# Patient Record
Sex: Male | Born: 1940 | Race: Black or African American | Hispanic: No | Marital: Married | Smoking: Never smoker
Health system: Southern US, Community
[De-identification: ages and names within clinical notes are randomized; demographics above are authoritative.]

## PROBLEM LIST (undated history)

## (undated) DIAGNOSIS — G4730 Sleep apnea, unspecified: Secondary | ICD-10-CM

## (undated) DIAGNOSIS — E0590 Thyrotoxicosis, unspecified without thyrotoxic crisis or storm: Secondary | ICD-10-CM

## (undated) DIAGNOSIS — F329 Major depressive disorder, single episode, unspecified: Secondary | ICD-10-CM

## (undated) DIAGNOSIS — I10 Essential (primary) hypertension: Secondary | ICD-10-CM

## (undated) DIAGNOSIS — F32A Depression, unspecified: Secondary | ICD-10-CM

## (undated) HISTORY — DX: Thyrotoxicosis, unspecified without thyrotoxic crisis or storm: E05.90

## (undated) HISTORY — PX: CHOLECYSTECTOMY: SHX55

## (undated) HISTORY — PX: BACK SURGERY: SHX140

## (undated) HISTORY — PX: CATARACT EXTRACTION, BILATERAL: SHX1313

## (undated) HISTORY — DX: Depression, unspecified: F32.A

## (undated) HISTORY — DX: Major depressive disorder, single episode, unspecified: F32.9

---

## 2014-02-22 ENCOUNTER — Other Ambulatory Visit (HOSPITAL_COMMUNITY): Payer: Self-pay | Admitting: "Endocrinology

## 2014-02-22 DIAGNOSIS — E0590 Thyrotoxicosis, unspecified without thyrotoxic crisis or storm: Secondary | ICD-10-CM

## 2014-03-01 ENCOUNTER — Encounter (HOSPITAL_COMMUNITY): Payer: Self-pay

## 2014-03-01 ENCOUNTER — Encounter (HOSPITAL_COMMUNITY)
Admission: RE | Admit: 2014-03-01 | Discharge: 2014-03-01 | Disposition: A | Discharge status: Home / Self Care | Payer: Medicare PPO | Source: Ambulatory Visit | Attending: Diagnostic Radiology | Admitting: Diagnostic Radiology

## 2014-03-01 DIAGNOSIS — E0590 Thyrotoxicosis, unspecified without thyrotoxic crisis or storm: Secondary | ICD-10-CM | POA: Insufficient documentation

## 2014-03-01 HISTORY — DX: Essential (primary) hypertension: I10

## 2014-03-01 MED ORDER — SODIUM IODIDE I 131 CAPSULE
9.0000 | Freq: Once | INTRAVENOUS | Status: AC | PRN
  Administered 2014-03-01: 9 via ORAL

## 2014-03-02 ENCOUNTER — Encounter (HOSPITAL_COMMUNITY)
Admission: RE | Admit: 2014-03-02 | Discharge: 2014-03-02 | Disposition: A | Discharge status: Home / Self Care | Payer: Medicare PPO | Source: Ambulatory Visit | Attending: "Endocrinology | Admitting: "Endocrinology

## 2014-03-02 DIAGNOSIS — E0590 Thyrotoxicosis, unspecified without thyrotoxic crisis or storm: Secondary | ICD-10-CM | POA: Diagnosis not present

## 2014-03-02 MED ORDER — SODIUM PERTECHNETATE TC 99M INJECTION
10.0000 | Freq: Once | INTRAVENOUS | Status: AC | PRN
  Administered 2014-03-02: 10 via INTRAVENOUS

## 2014-03-05 ENCOUNTER — Other Ambulatory Visit (HOSPITAL_COMMUNITY): Payer: Self-pay | Admitting: "Endocrinology

## 2014-03-05 DIAGNOSIS — E041 Nontoxic single thyroid nodule: Secondary | ICD-10-CM

## 2014-03-11 ENCOUNTER — Ambulatory Visit (HOSPITAL_COMMUNITY): Admission: RE | Admit: 2014-03-11 | Payer: Medicare PPO | Source: Ambulatory Visit

## 2014-03-18 ENCOUNTER — Other Ambulatory Visit (HOSPITAL_COMMUNITY): Payer: Self-pay | Admitting: "Endocrinology

## 2014-03-18 ENCOUNTER — Ambulatory Visit (HOSPITAL_COMMUNITY)
Admission: RE | Admit: 2014-03-18 | Discharge: 2014-03-18 | Disposition: A | Discharge status: Home / Self Care | Payer: Medicare PPO | Source: Ambulatory Visit | Attending: "Endocrinology | Admitting: "Endocrinology

## 2014-03-18 ENCOUNTER — Encounter (HOSPITAL_COMMUNITY): Payer: Self-pay

## 2014-03-18 DIAGNOSIS — E042 Nontoxic multinodular goiter: Secondary | ICD-10-CM

## 2014-03-18 DIAGNOSIS — E041 Nontoxic single thyroid nodule: Secondary | ICD-10-CM | POA: Diagnosis not present

## 2014-03-18 MED ORDER — LIDOCAINE HCL (PF) 2 % IJ SOLN
INTRAMUSCULAR | Status: AC
  Filled 2014-03-18: qty 10

## 2014-03-18 MED ORDER — LIDOCAINE HCL (PF) 2 % IJ SOLN
10.0000 mL | Freq: Once | INTRAMUSCULAR | Status: AC
  Administered 2014-03-18: 10 mL

## 2014-03-18 NOTE — Procedures (Signed)
PreOperative Dx: Multiple thyroid nodules Postoperative Dx: Multiple thyroid thyroid nodules Procedure:   US guided FNA of 3 thyroid nodules Radiologist:  Tyron RussellBoles Anesthesia:  3.5 ml of 2% lidocaine Specimen:  FNA x 3 thyroid nodules  EBL:   < 1 ml Complications: None

## 2014-03-18 NOTE — Discharge Instructions (Signed)
Thyroid Biopsy °The thyroid gland is a butterfly-shaped gland situated in the front of the neck. It produces hormones which affect metabolism, growth and development, and body temperature. A thyroid biopsy is a procedure in which small samples of tissue or fluid are removed from the thyroid gland or mass and examined under a microscope. This test is done to determine the cause of thyroid problems, such as infection, cancer, or other thyroid problems. °There are 2 ways to obtain samples: °1. Fine needle biopsy. Samples are removed using a thin needle inserted through the skin and into the thyroid gland or mass. °2. Open biopsy. Samples are removed after a cut (incision) is made through the skin. °LET YOUR CAREGIVER KNOW ABOUT:  °· Allergies. °· Medications taken including herbs, eye drops, over-the-counter medications, and creams. °· Use of steroids (by mouth or creams). °· Previous problems with anesthetics or numbing medicine. °· Possibility of pregnancy, if this applies. °· History of blood clots (thrombophlebitis). °· History of bleeding or blood problems. °· Previous surgery. °· Other health problems. °RISKS AND COMPLICATIONS °· Bleeding from the site. The risk of bleeding is higher if you have a bleeding disorder or are taking any blood thinning medications (anticoagulants). °· Infection. °· Injury to structures near the thyroid gland. °BEFORE THE PROCEDURE  °This is a procedure that can be done as an outpatient. Confirm the time that you need to arrive for your procedure. Confirm whether there is a need to fast or withhold any medications. A blood sample may be done to determine your blood clotting time. Medicine may be given to help you relax (sedative). °PROCEDURE °Fine needle biopsy. °You will be awake during the procedure. You may be asked to lie on your back with your head tipped backward to extend your neck. Let your caregiver know if you cannot tolerate the positioning. An area on your neck will be  cleansed. A needle is inserted through the skin of your neck. You may feel a mild discomfort during this procedure. You may be asked to avoid coughing, talking, swallowing, or making sounds during some portions of the procedure. The needle is withdrawn once tissue or fluid samples have been removed. Pressure may be applied to the neck to reduce swelling and ensure that bleeding has stopped. The samples will be sent for examination.  °Open biopsy. °You will be given general anesthesia. You will be asleep during the procedure. An incision is made in your neck. A sample of thyroid tissue or the mass is removed. The tissue sample or mass will be sent for examination. The sample or mass may be examined during the biopsy. If the sample or mass contains cancer cells, some or all of the thyroid gland may be removed. The incision is closed with stitches. °AFTER THE PROCEDURE  °Your recovery will be assessed and monitored. If there are no problems, as an outpatient, you should be able to go home shortly after the procedure. °If you had a fine needle biopsy: °· You may have soreness at the biopsy site for 1 to 2 days. °If you had an open biopsy:  °· You may have soreness at the biopsy site for 3 to 4 days. °· You may have a hoarse voice or sore throat for 1 to 2 days. °Obtaining the Test Results °It is your responsibility to obtain your test results. Do not assume everything is normal if you have not heard from your caregiver or the medical facility. It is important for you to follow up   on all of your test results. °HOME CARE INSTRUCTIONS  °· Keeping your head raised on a pillow when you are lying down may ease biopsy site discomfort. °· Supporting the back of your head and neck with both hands as you sit up from a lying position may ease biopsy site discomfort. °· Only take over-the-counter or prescription medicines for pain, discomfort, or fever as directed by your caregiver. °· Throat lozenges or gargling with warm salt  water may help to soothe a sore throat. °SEEK IMMEDIATE MEDICAL CARE IF:  °· You have severe bleeding from the biopsy site. °· You have difficulty swallowing. °· You have a fever. °· You have increased pain, swelling, redness, or warmth at the biopsy site. °· You notice pus coming from the biopsy site. °· You have swollen glands (lymph nodes) in your neck. °Document Released: 02/18/2007 Document Revised: 08/18/2012 Document Reviewed: 07/16/2013 °[employer]® Patient Information ©2015 [employer]. This information is not intended to replace advice given to you by your health care provider. Make sure you discuss any questions you have with your health care provider. ° °

## 2015-08-02 IMAGING — US US THYROID BIOPSY
1 series · 13 of 23 positions shown · non-contrast
Comparison: none

CLINICAL DATA: Multiple thyroid nodules/masses

[Series 1: us thyroid biopsy · 0.05mm/px · 13 of 23 slices shown]
[im 1/23]
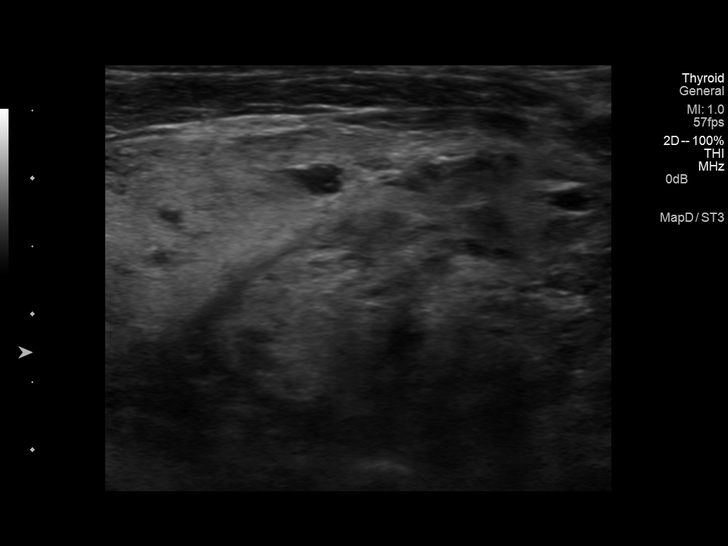
[im 3/23]
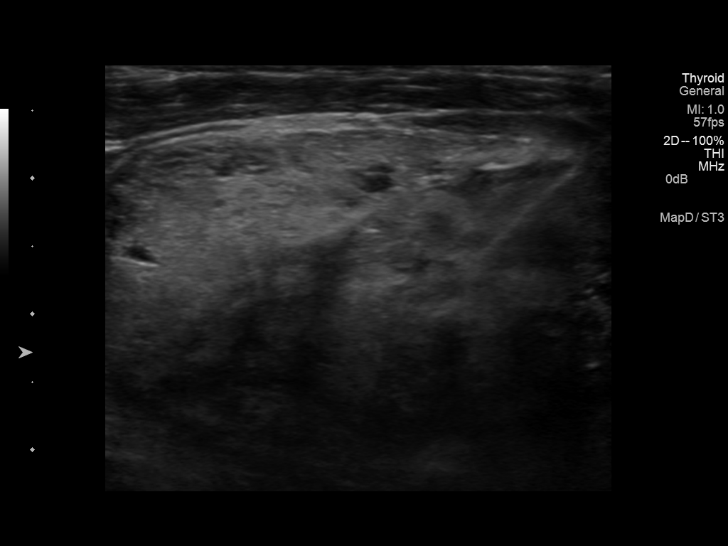
[im 5/23]
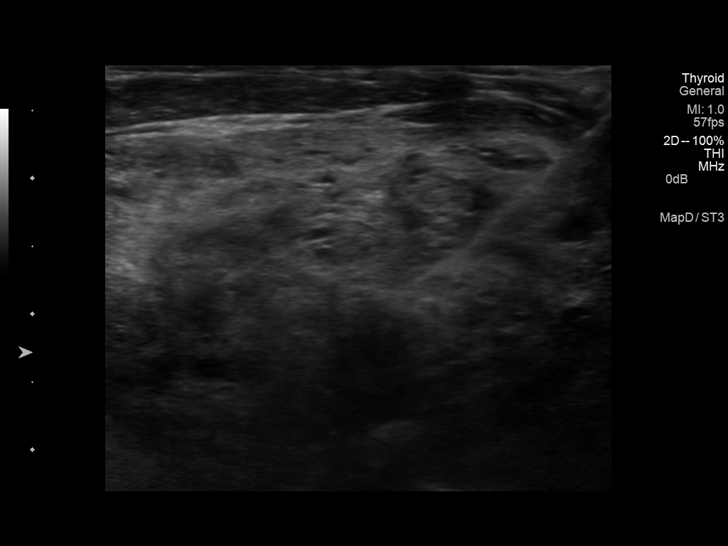
[im 7/23]
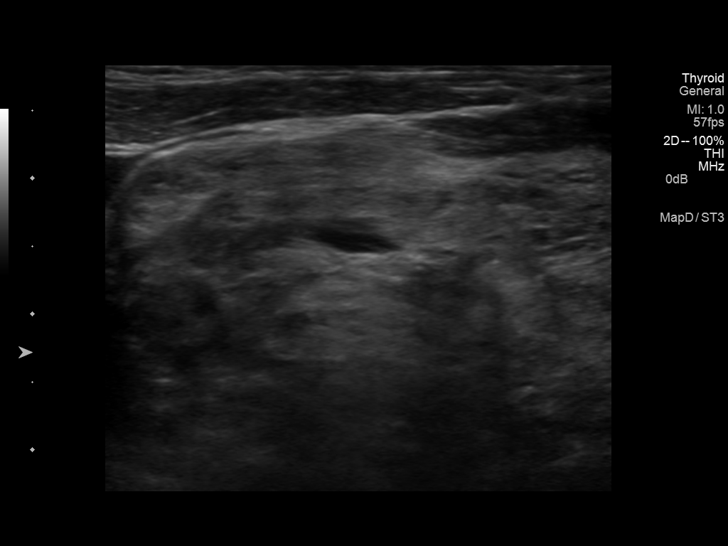
[im 8/23]
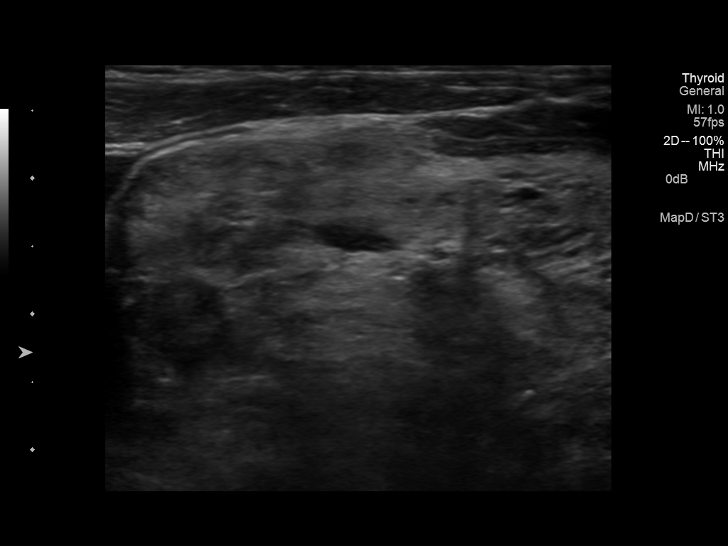
[im 10/23]
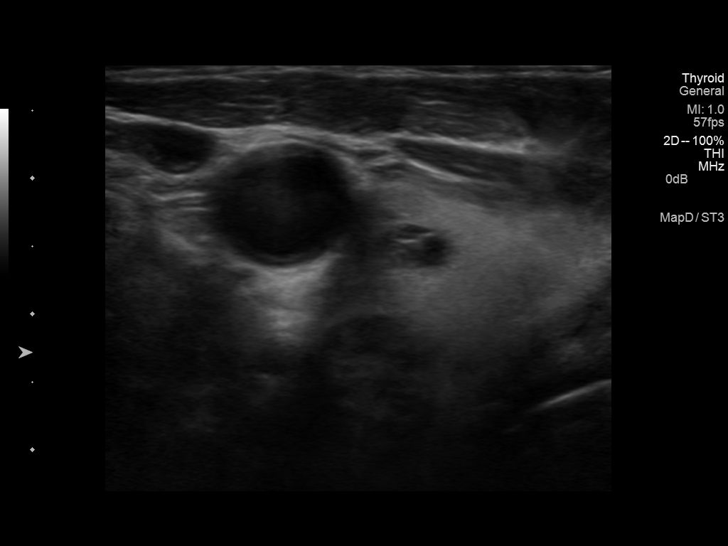
[im 12/23]
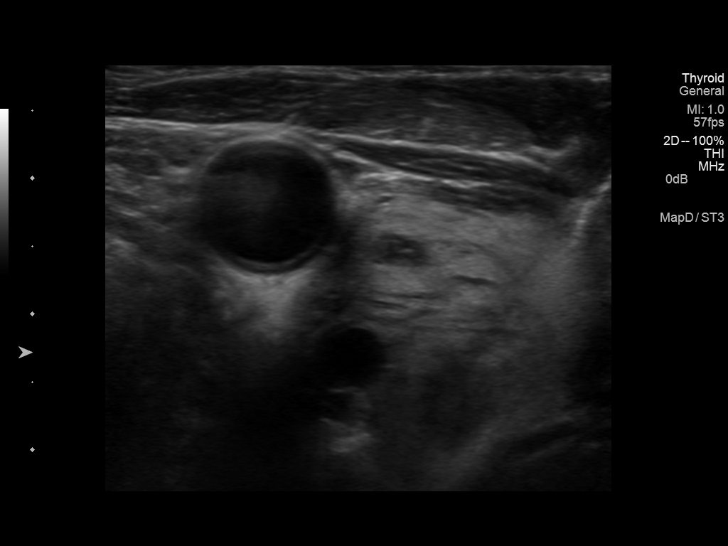
[im 14/23]
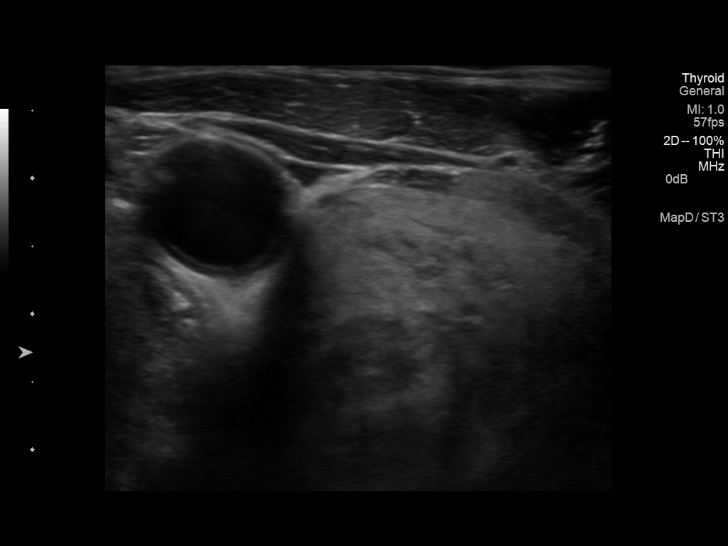
[im 16/23]
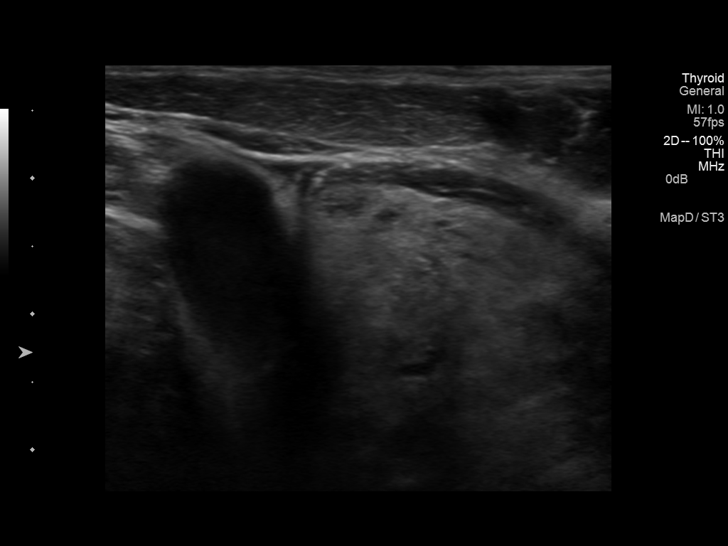
[im 17/23]
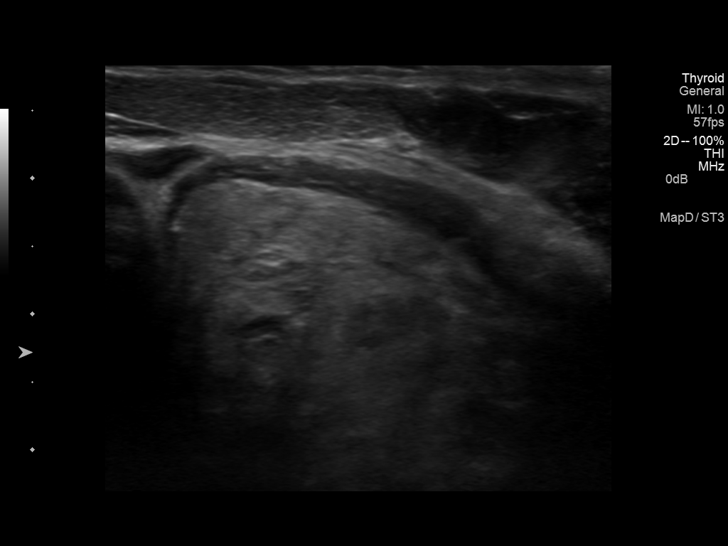
[im 19/23]
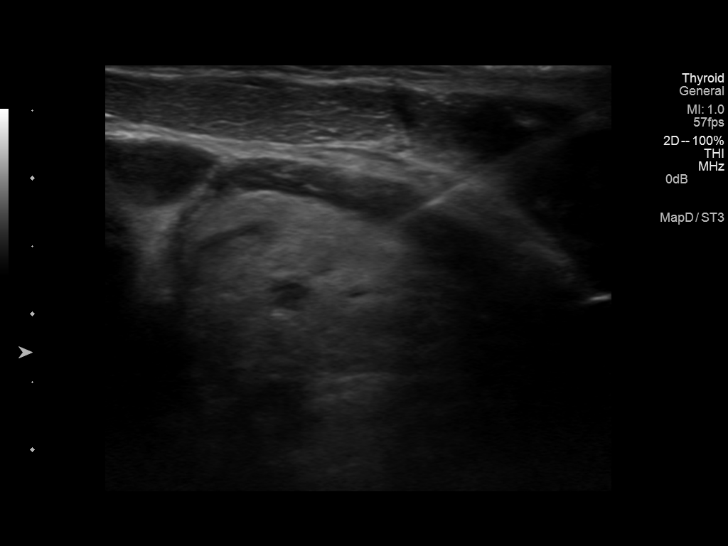
[im 21/23]
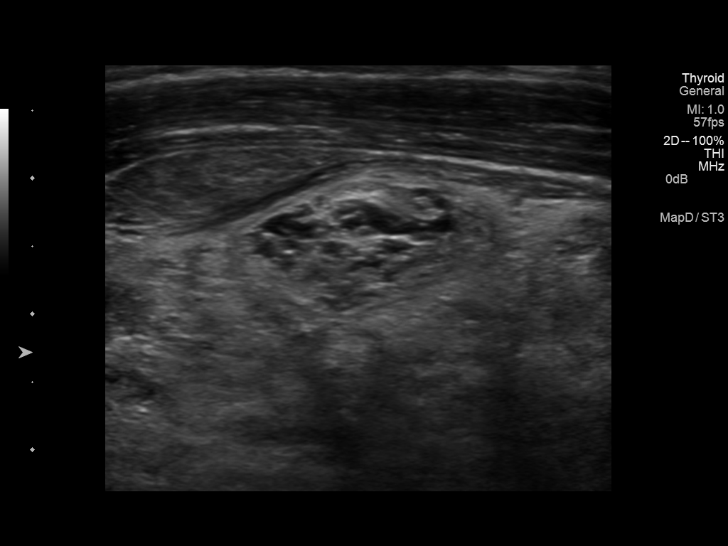
[im 23/23]
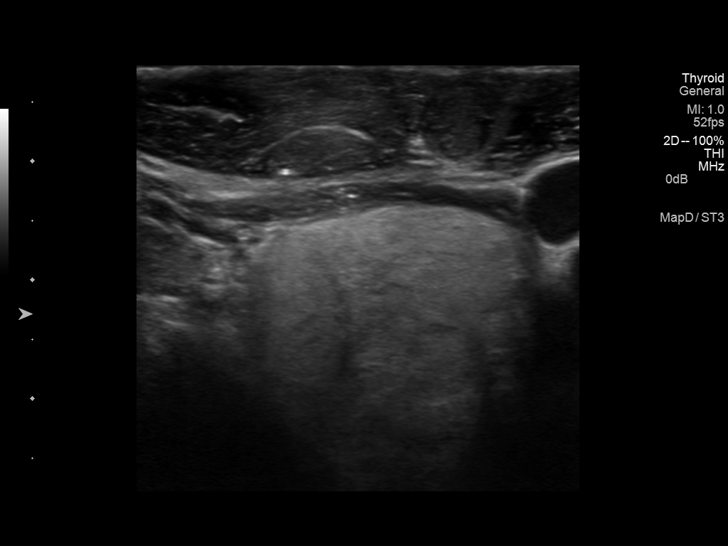

[13 of 23 positions shown; findings below may reference images not displayed]

EXAM:
ULTRASOUND GUIDED FNA BIOPSY OF THYROID NODULE

ULTRASOUND GUIDED FNA BIOPSY OF ADDITIONAL THYROID NODULE

ULTRASOUND GUIDED FNA BIOPSY OF ADDITIONAL THYROID NODULE

MEDICATIONS:
2% LIDOCAINE, 3.5 ml

PROCEDURE:
Procedure, risks, benefits and alternatives discussed with the
patient.

Patient's questions answered.

Written informed consent for thyroid nodule FNA obtained.

Time-out protocol followed.

Three thyroid nodules/masses in both lobes were localized by
ultrasound, including dominant nodules in the upper/mid RIGHT lobe,
mid LEFT lobe, in the inferior pole LEFT lobe.

Skin prepped and draped in usual sterile fashion.

Skin and overlying soft tissues anesthetized with lidocaine.

Under direct sonographic visualization, 4 fine-needle aspirates of
the largest mass involving the upper/mid RIGHT lobe were obtained,
third specimen appearing hypocellular.

Subsequently, 3 fine-needle aspirates of the LEFT mid lobe nodule
were then obtained.

Subsequently, 3 fine-needle aspirates of the inferior pole LEFT lobe
mass were then obtained.

Procedure tolerated well by patient.

Specimens sent to cytology for evaluation.

No evidence of hemorrhage on postprocedural imaging.

Routine post biopsy instructions were provided to the patient.

COMPLICATIONS:
None
FINDINGS: As above
IMPRESSION: Ultrasound-guided FNA of 3 thyroid nodules/masses as above.

## 2015-08-02 IMAGING — US US SOFT TISSUE HEAD/NECK
1 series · 13 of 25 positions shown · non-contrast
Comparison: Thyroid uptake and scan 03/02/2014

CLINICAL DATA: Multinodular goiter, cold nodules on radionuclide
imaging

EXAM:
THYROID ULTRASOUND
TECHNIQUE: Ultrasound examination of the thyroid gland and adjacent soft
tissues was performed.

[Series 1: us soft tissue head/neck · 0.07mm/px · 13 of 67 slices shown]
[im 1/67]
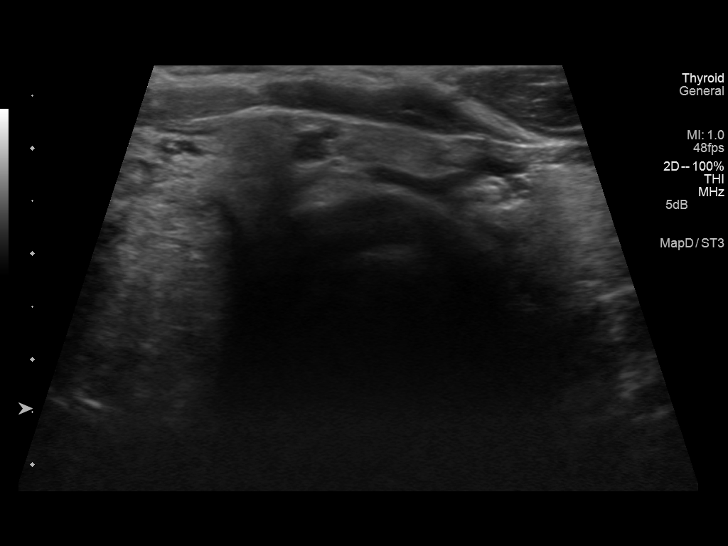
[im 6/67]
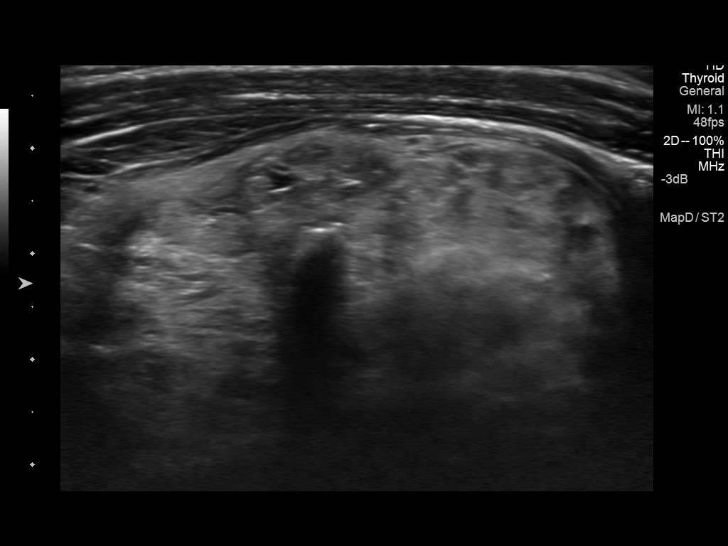
[im 12/67]
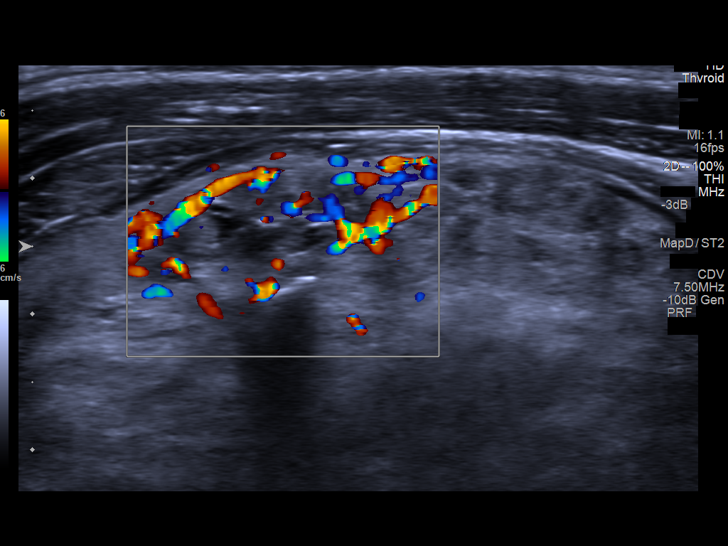
[im 17/67]
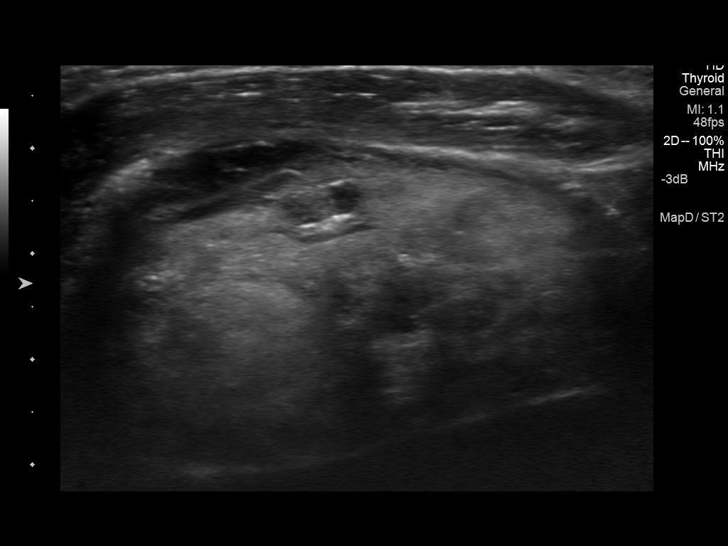
[im 23/67]
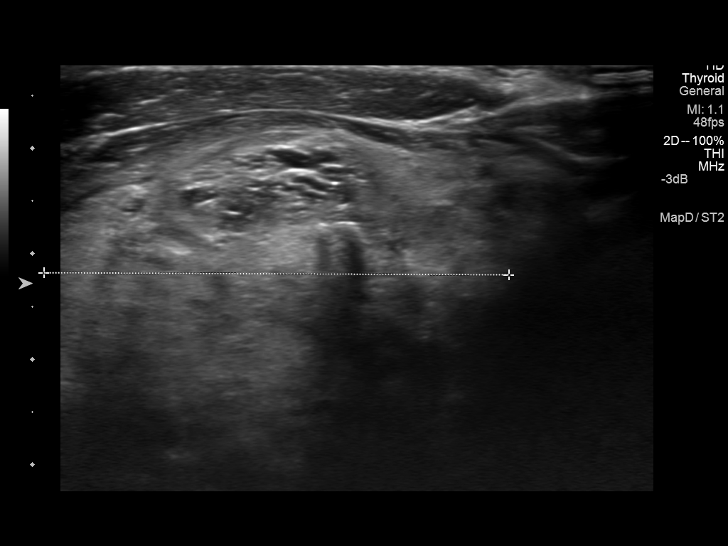
[im 28/67]
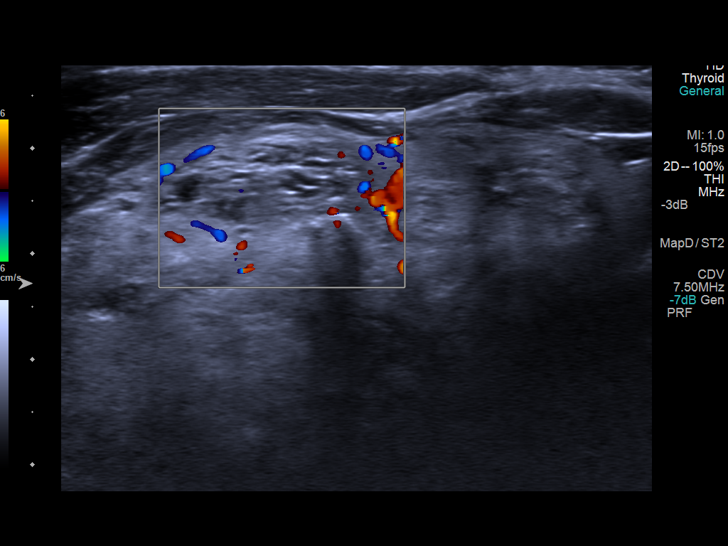
[im 34/67]
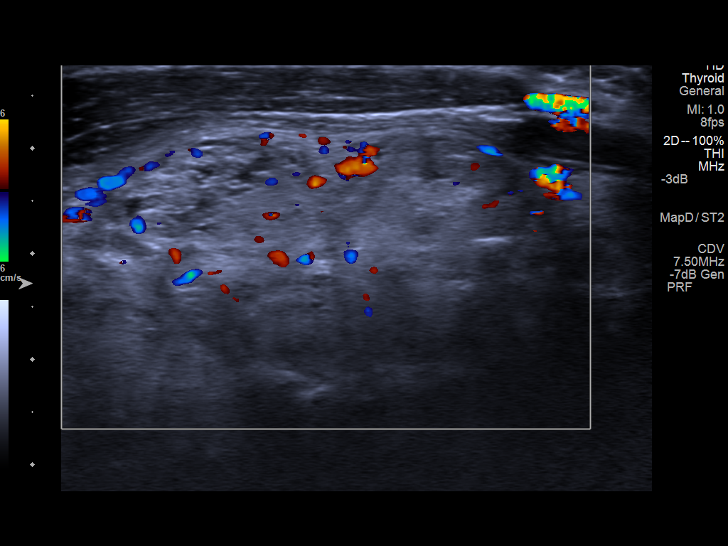
[im 39/67]
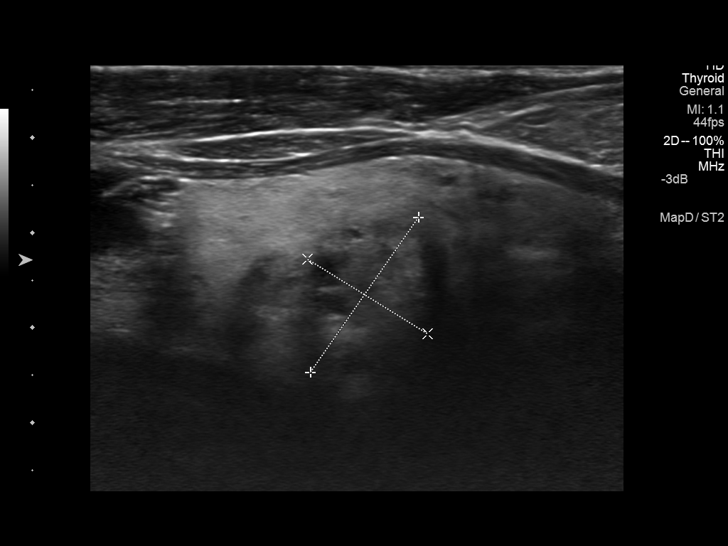
[im 45/67]
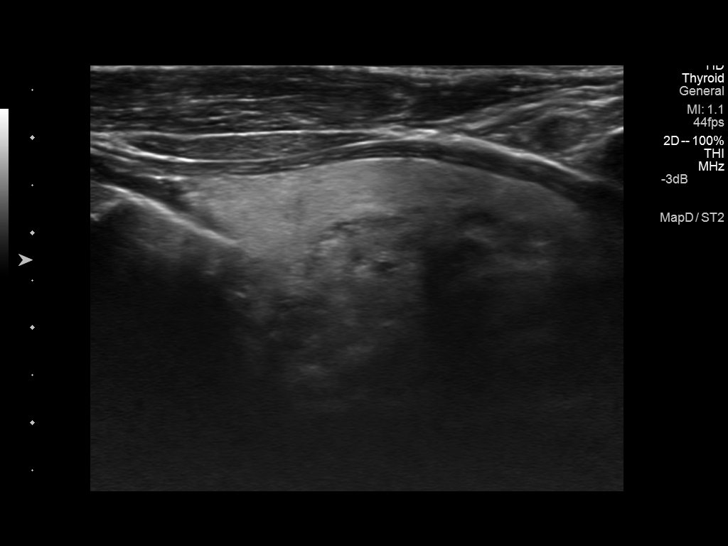
[im 50/67]
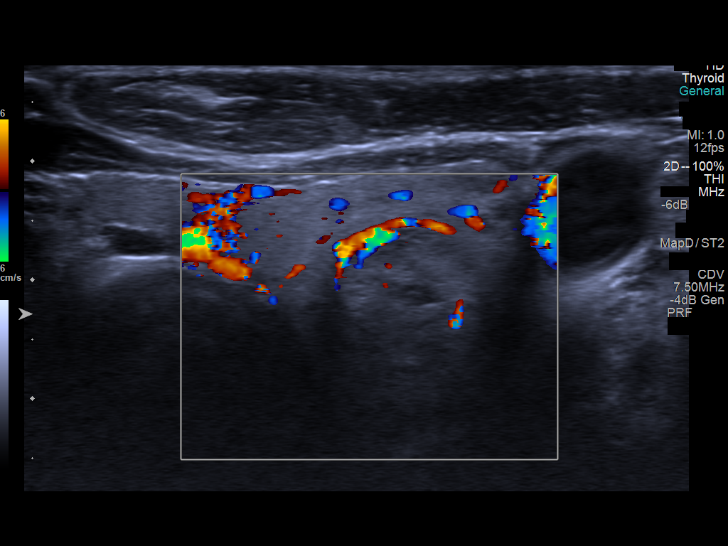
[im 56/67]
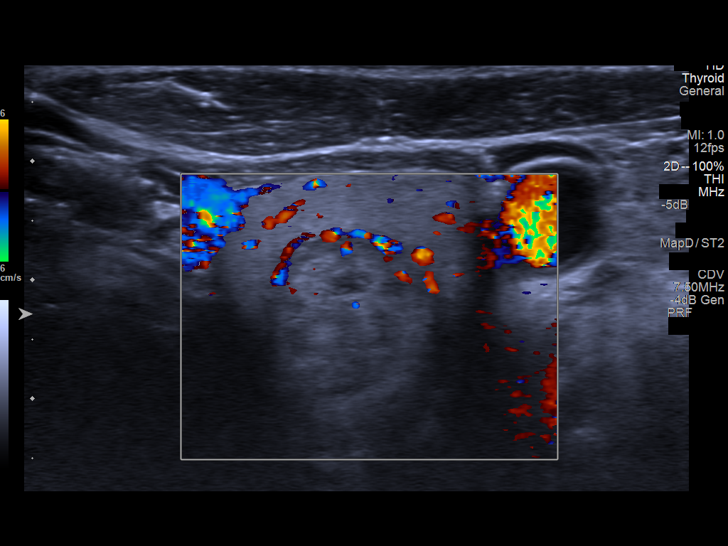
[im 61/67]
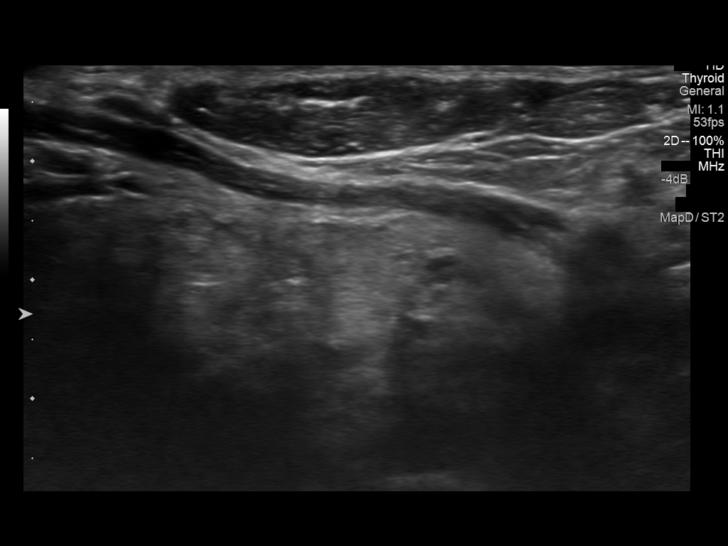
[im 67/67]
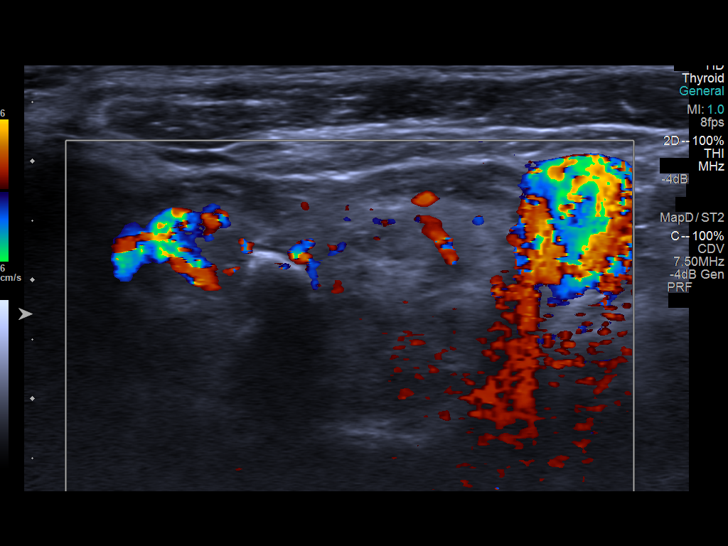

[13 of 25 positions shown; findings below may reference images not displayed]

FINDINGS: Right thyroid lobe

Measurements: 5.2 x 2.9 x 4.4 cm. Small amount normal thyroid tissue
seen at the inferior pole of the RIGHT lobe. A focal area of
nodularity 18 x 12 x 22 mm is seen in the upper pole region.
However, at real-time imaging this appears to represent only a small
portion of a very large heterogeneous mass involving the majority of
the upper and mid portions of the RIGHT lobe, approximately 5.2 x
2.7 cm in size, corresponding to large area of decreased tracer
localization on radionuclide imaging.

Left thyroid lobe

Measurements: 5.4 x 2.5 x 2.1 cm. Multiple nodules. Midpole nodule
measures 20 x 15 x 18 mm. Additional larger complex appearing nodule
is seen at the lower pole 28 x 20 x 31 mm. This larger nodule
corresponds to a cold area on radionuclide imaging.

Isthmus

Thickness: 8 mm thick.  No definite nodules

Lymphadenopathy

None identified
IMPRESSION: Large RIGHT and several large LEFT thyroid nodules as above.

Findings meet consensus criteria for biopsy. Ultrasound-guided fine
needle aspiration should be considered, as per the consensus
statement: Management of Thyroid Nodules Detected at US: Society of
Radiologists in Ultrasound Consensus Conference Statement. Radiology

## 2015-11-18 ENCOUNTER — Ambulatory Visit (INDEPENDENT_AMBULATORY_CARE_PROVIDER_SITE_OTHER): Payer: Medicare PPO | Admitting: "Endocrinology

## 2015-11-18 ENCOUNTER — Encounter: Payer: Self-pay | Admitting: "Endocrinology

## 2015-11-18 VITALS — BP 138/70 | HR 75 | Ht 66.0 in | Wt 189.0 lb

## 2015-11-18 DIAGNOSIS — E0590 Thyrotoxicosis, unspecified without thyrotoxic crisis or storm: Secondary | ICD-10-CM

## 2015-11-18 DIAGNOSIS — E042 Nontoxic multinodular goiter: Secondary | ICD-10-CM | POA: Diagnosis not present

## 2015-11-18 DIAGNOSIS — E890 Postprocedural hypothyroidism: Secondary | ICD-10-CM | POA: Insufficient documentation

## 2015-11-18 NOTE — Progress Notes (Signed)
Subjective:    Patient ID: Scott Underwood, male    DOB: [birth year], PCP Scott Courts, DO   Past Medical History  Diagnosis Date  . Hypertension   . Hyperthyroidism   . Depression    No past surgical history on file. Social History   Social History  . Marital Status: Married    Spouse Name: N/A  . Number of Children: N/A  . Years of Education: N/A   Social History Main Topics  . Smoking status: Never Smoker   . Smokeless tobacco: Not on file  . Alcohol Use: No  . Drug Use: No  . Sexual Activity: Not on file   Other Topics Concern  . Not on file   Social History Narrative   Outpatient Encounter Prescriptions as of 11/18/2015  Medication Sig  . amLODipine (NORVASC) 5 MG tablet Take 5 mg by mouth daily.  Marland Kitchen aspirin 81 MG tablet Take 81 mg by mouth daily.  . DULoxetine (CYMBALTA) 30 MG capsule Take 30 mg by mouth daily.  . finasteride (PROSCAR) 5 MG tablet Take 5 mg by mouth daily.  . metoprolol tartrate (LOPRESSOR) 25 MG tablet Take 25 mg by mouth 2 (two) times daily.  . Multiple Vitamin (MULTIVITAMIN) tablet Take 1 tablet by mouth daily.  . nitroGLYCERIN (NITROSTAT) 0.4 MG SL tablet Place 0.4 mg under the tongue every 5 (five) minutes as needed for chest pain.  Marland Kitchen terazosin (HYTRIN) 10 MG capsule Take 10 mg by mouth 2 (two) times daily.   No facility-administered encounter medications on file as of 11/18/2015.   ALLERGIES: Allergies  Allergen Reactions  . Shellfish Allergy    VACCINATION STATUS:  There is no immunization history on file for this patient.  HPI  75 year old gentleman with medical history as above. He is being seen in follow-up for history of multinodular goiter with on and off subclinical hyperthyroidism. He was seen by me up until November 2015 at which time he did have a negative biopsy of right sided thyroid nodule. She was supposed to return for follow-up in 6 months however he did not return. In the meantime he was hospitalized for  unrelated issue in [facility] and May 2017 where he was found to have hyperthyroidism. He reports that he was initiated on methimazole. Subsequently went to [facility] for another unrelated issue, he was advised to stop methimazole. He is thyroid ultrasound wasn't repeated in [facility]. Findings are summarized below. -He describes occasional choking sensation, significant snoring problem affecting his sleep. -He denies shortness of breath nor voice change. He denies family history of thyroid cancer. He denies any exposure to neck radiation.  Review of Systems Constitutional: steady weight,  no fatigue, no subjective hyperthermia/hypothermia Eyes: no blurry vision, no xerophthalmia ENT: no sore throat, + thyroid nodules palpated in throat, + occasional choking/ dysphagia,  no hoarseness Cardiovascular: no CP/SOB/palpitations/leg swelling Respiratory: no cough/SOB Gastrointestinal: no N/V/D/C Musculoskeletal: no muscle/joint aches Skin: no rashes Neurological: no tremors/numbness/tingling/dizziness Psychiatric: no depression/anxiety  Objective:    BP 138/70 mmHg  Pulse 75  Ht  (1.676 m)  Wt 189 lb (85.73 kg)  BMI 30.52 kg/m2  Wt Readings from Last 3 Encounters:  11/18/15 189 lb (85.73 kg)    Physical Exam Constitutional: overweight, in NAD Eyes: PERRLA, EOMI, no exophthalmos ENT: moist mucous membranes, large goiter significantly increased from last exam in 2015 , no cervical lymphadenopathy Cardiovascular: RRR, No MRG Respiratory: CTA B Gastrointestinal: abdomen soft, NT, ND, BS+  Musculoskeletal: no deformities, strength intact in all 4 Skin: moist, warm, no rashes Neurological: no tremor with outstretched hands, DTR normal in all 4   Labs from outside facility on 10/14/2015 showed TSH less than 0.1, free T4 1.01, ft3 0.96 -[facility]: Right lobe enlarged to 9.7 cm with no discrete  nodules , with diffuse microcalcifications, left lobe enlarged to 6.5 cm with 1.5 centimeter predominantly solid nodule .   Assessment & Plan:  1. Multinodular goiter 2. Subclinical hyperthyroidism - I had a long discussion with this patient regarding his choices. Given the fact that that thyroid grew significantly from 5.2 cm in 2015 to 9.7 cm for the right lobe and from 5.4 cm to 6.5 cm with a moderately suspicious new nodule on the left lobe, I suggested total thyroidectomy as a best option for him. He agrees. I will arrange for surgical referral for him. -Total thyroidectomy would eliminate the need for treatment of subclinical hyperthyroidism and or biopsy of thyroid nodules. -He is made aware of the fact that subsequent replacement with thyroid hormone would be needed if he gets thyroidectomy.  - He will return in 1 week after his surgery with new set of thyroid function test. -30 minutes of time was spent in the care of this patient, half of which was dedicated for counseling. - I advised patient to maintain close follow up with Scott CourtsFAVERO,JOHN PATRICK, DO for primary care needs. Follow up plan: Return in about 4 weeks (around 12/16/2015), or 1 week after his surgery, for 1 week after his surgery.  Scott LunchGebre Tova Vater, MD Phone: [phone]  Fax: [phone]   11/18/2015, 4:08 PM

## 2015-12-02 NOTE — H&P (Signed)
Surgical History and Physical  Subjective: 75 year old male presents upon referral from endocrinologist Dr. Fransico Underwood fora significantly enlarged multinodular goiter. Though he's previously been documented otherwise, he currently denies any choking or diifficulty swallowing and denies SOB. Patient likewise denies any palpitations, fever/chills, or altered heat/cold intolerance. Patient does, however, express concern about the rate of his thyroid's recent growth. He also asks if his thyroid function may be contributing to a recent overall feeting of fatigue with mild nausea and decreased appetite. In regard to exercise tolerance, he states that he is typically able to walk many blocks (up to 2 miles) and up/down flights of steps without CP or SOB.  Patient was previously followed by Dr. Fransico Underwood for his multinodular goiter, for which he had biopsies performed in 03/2014 that did not reveal any evidence of malignancy. However, he was lost to follow-up, was hospitalized at [facility] for an unrelated reason in 09/2015, where he was diagnosed with hyperthyroidism and started on methimazole, after which he reports feeling "terrible" (fatigued, nauseous), presented to [facility] where methimazole was stopped, after which he felt only somewhat better. A repeat ultrasound was also performed, which demonstrated growth of his Right thyroid lobe to 9.7 cm (from 5.2 cm in 2015) and Left thyroid lobe to 6.5 cm (from 5.4 cm in 2015), for which he returned to Dr. Fransico Underwood 7/14, who refers patient for total thyroidectomy.   Review of Symptoms:  Constitutional:No fevers, chills, or unexplained weight loss Head:Atraumatic; no masses; no abnormalities Eyes:No visual changes or eye pain Nose/Mouth/Throat:No nasal congestion, rhinorrhea, oral lesions, postnasal drip or sore throat  Cardiovascular: No chest pain or palpitations.  Respiratory:No cough, shortness of breath or wheezing  GastrointestinNo  diarrhea, constipation, blood in stools, abdominal pain, vomiting or heartburn Genitourinary:+Urinary frequency with known BPH, no hematuria, incontinence, or dysuria Musculoskeletal:+chronic back pain, No other arthalgias, myalgias or joint swelling Skin:No rash or bothersome skin lesions Hematolgic/Lymphatic:No easy bruising, easy bleeding or swollen glands   Past Medical History:Reviewed  Past Medical History  Surgical History:  TURP, spine surgery Medical Problems:  HTN, BPH, chronic back pain   Social History:Reviewed  Social History  Preferred Language: English Race:  Black or African American Ethnicity: Not Hispanic / Latino Age: 68 year Marital Status:  W  Smoking Status: Never smoker reviewed on 12/01/2015  Functional Status ------------------------------------------------ Bathing: Normal Cooking: Normal Dressing: Normal Driving: Normal Eating: Normal Managing Meds: Normal Oral Care: Normal Shopping: Normal Toileting: Normal Transferring: Normal Walking: Normal  Cognitive Status ------------------------------------------------ Attention: Normal Decision Making: Normal Language: Normal Memory: Normal Motor: Normal Perception: Normal Problem Solving: Normal Visual and Spatial: Normal   Family History:Reviewed  Family Health History Mother, Deceased; Cancer unspecified;  Father, Deceased; Stroke (CVA);    Vital Signs as of 11/30/2015:  Systolic 156: Diastolic 67: Heart Rate 68: Temp 98.34F (Temporal) Height 59ft 5.5in: Weight 195Lbs 0 Ounces: BMI 31.96   Physical Exam: General:Well appearing, well nourished in no distress. Skin:no rash or prominent lesions Head:Atraumatic; no masses; no abnormalities Eyes:conjunctiva clear, EOM intact, PERRL Mouth:Mucous membranes moist, no mucosal lesions. Neck:Supple without lymphadenopathy. Thyroid is very large/full, particularly on Right side, though without tenderness. Heart:RRR, no  murmur Lungs:CTA bilaterally, no wheezes, rhonchi, rales.  Breathing unlabored. Abdomen:Soft, NT/ND, no HSM, no masses. Extremities:No deformities, clubbing, cyanosis, or edema.   Assessment: 74 year old Male with questionably symptomatic and significant anxiety about recent significant growth of a very large multinodular goiter without recent biopsies due in part to concern regarding to  likelihood of sampling error with such a large multinodular goiter and recent high rate of growth (particularly of the Right lobe).   Plan:      - all risks, benefits, and alternatives to total thyroidectomy discussed with patient and all of his questions were answered to his expressed satisfaction      - patient wishes expresses that he wishes to proceed with total thyroidectomy, and informed consent was accordingly obtained      - will plan for total thyroidectomy Monday, 8/7 following anesthesia pre-operative evaluation      - follow-up with Dr. Fransico Underwood 1 week post-surgery and surgical follow-up 2 weeks after surgery      - patient instructed to call if any questions or concerns  -- Scott Cower E. Earlene Plater, MD, RPVI [place]: [facility] Office #: [phone]

## 2015-12-06 NOTE — Patient Instructions (Signed)
Scott Underwood  12/06/2015     @   Your procedure is scheduled on 12/12/2015.  Report to [facility] at 6:15 A.M.  Call this number if you have problems the morning of surgery:  [phone]   Remember:  Do not eat food or drink liquids after midnight.  Take these medicines the morning of surgery with A SIP OF WATER Metoprolol, Amlodipine, Cymbalta, Proscar, Hytrin   Do not wear jewelry, make-up or nail polish.  Do not wear lotions, powders, or perfumes.  You may wear deoderant.  Do not shave 48 hours prior to surgery.  Men may shave face and neck.  Do not bring valuables to the hospital.  [facility] is not responsible for any belongings or valuables.  Contacts, dentures or bridgework may not be worn into surgery.  Leave your suitcase in the car.  After surgery it may be brought to your room.  For patients admitted to the hospital, discharge time will be determined by your treatment team.  Patients discharged the day of surgery will not be allowed to drive home.    Please read over the following fact sheets that you were given. Surgical Site Infection Prevention and Anesthesia Post-op Instructions     PATIENT INSTRUCTIONS POST-ANESTHESIA  IMMEDIATELY FOLLOWING SURGERY:  Do not drive or operate machinery for the first twenty four hours after surgery.  Do not make any important decisions for twenty four hours after surgery or while taking narcotic pain medications or sedatives.  If you develop intractable nausea and vomiting or a severe headache please notify your doctor immediately.  FOLLOW-UP:  Please make an appointment with your surgeon as instructed. You do not need to follow up with anesthesia unless specifically instructed to do so.  WOUND CARE INSTRUCTIONS (if applicable):  Keep a dry clean dressing on the anesthesia/puncture wound site if there is drainage.  Once the wound has quit draining you may leave it open to air.  Generally you should leave  the bandage intact for twenty four hours unless there is drainage.  If the epidural site drains for more than 36-48 hours please call the anesthesia department.  QUESTIONS?:  Please feel free to call your physician or the hospital operator if you have any questions, and they will be happy to assist you.      Thyroidectomy A thyroidectomy is a surgery that is done to remove all (total thyroidectomy) or part (subtotal thyroidectomy) of your thyroid gland. The thyroid is a butterfly-shaped gland that is located at the lower front of your neck. It produces a substance that helps to control certain body processes (thyroid hormone). The amount of thyroid gland tissue that is removed during your thyroidectomy depends on the reason you need the procedure. You may have a thyroidectomy to treat conditions including:  Thyroid nodules.  Thyroid cancer.  Benign thyroid tumors.  Goiter.  Overactive thyroid gland (hyperthyroidism). There are different ways to do a thyroidectomy:   Conventional thyroidectomy (open thyroidectomy). This procedure is the most common. In this procedure, the thyroid gland is removed through one surgical cut (incision) in the neck.  Endoscopic thyroidectomy. This procedure is less invasive. In this procedure, there may be several smaller incisions in the neck, chest, or armpit. The surgeon uses a tiny camera and other assistive tools to remove the thyroid gland. LET [facility] CARE PROVIDER KNOW ABOUT:   Any allergies you have.  All medicines you are taking, including vitamins, herbs, eye drops, creams, and over-the-counter medicines.  Previous problems you or members of your family have had with the use of anesthetics.  Any blood disorders you have.  Previous surgeries you have had.  Medical conditions you have. RISKS AND COMPLICATIONS Generally, this is a safe procedure. However, problems can occur and include:  A decrease in parathyroid hormone levels  (hypoparathyroidism). Your parathyroid glands are located behind your thyroid gland, and they maintain the calcium level in your body. If these glands are damaged during surgery, your calcium level will drop. This will make your nerves irritable and cause muscle spasms.  An increase in thyroid hormone.  Damage to the nerves of your voice box (larynx).  Bleeding.  Infection at the site of the incision or incisions.  Temporary breathing difficulties. This is a very rare complication. It usually goes away within weeks. BEFORE THE PROCEDURE  Your health care provider will perform a physical exam and assess your voice for vocal changes.  Ask your health care provider about:  Changing or stopping your regular medicines. This is especially important if you are taking diabetes medicines or blood thinners.  Taking medicines such as aspirin and ibuprofen. These medicines can thin your blood. Do not take these medicines before your procedure if your health care provider instructs you not to.  Follow instructions from your health care provider about eating or drinking restrictions. PROCEDURE You will be given a medicine that makes you go to sleep (general anesthetic). Depending on which type of thyroidectomy you have, this is what may happen during the procedure: Conventional Thyroidectomy  The surgeon will make an incision in the center of your lower neck.  The muscles in your neck will be separated to reveal your thyroid gland.  Part or all of your thyroid gland will be removed.  You may need a tube (catheter) at the incision site to drain blood and fluids that accumulate under the skin after the procedure.The catheter may have to stay in place for a day or two after the procedure.  The incision will be closed with stitches (sutures). Endoscopic Thyroidectomy  The surgeon will make several small incisions in your neck, chest, or armpit.  The surgeon will use a narrow tube with a light  and camera at the end (endoscope). The surgeon will insert the endoscope into an incision.  Part or all of your thyroid gland will be removed.  You may need a catheter at the incision site to drain blood and fluids that accumulate under the skin after the procedure.The catheter may have to stay in place for a day or two after the procedure.  The incision will be closed with sutures. AFTER THE PROCEDURE  Your blood pressure, heart rate, breathing rate, and blood oxygen level will be monitored often until the medicines you were given have worn off.  Depending on the type of thyroidectomy you had, you may have:  A swollen neck.  Some mild neck pain.  A slightly sore throat.  A weak voice.  You will not be able to eat or drink until your health care provider says it is okay.  You may have a blood test to check the level of calcium in your body.  If you had a catheter put in during the procedure, it will usually be removed the next day.   This information is not intended to replace advice given to you by your health care provider. Make sure you discuss any questions you have with your health care provider.   Document Released: 10/17/2000 Document  Revised: 09/07/2014 Document Reviewed: 09/23/2013 Elsevier Interactive Patient Education [employer]

## 2015-12-08 ENCOUNTER — Encounter (HOSPITAL_COMMUNITY)
Admission: RE | Admit: 2015-12-08 | Discharge: 2015-12-08 | Disposition: A | Discharge status: Home / Self Care | Payer: Medicare PPO | Source: Ambulatory Visit | Attending: Surgery | Admitting: Surgery

## 2015-12-08 ENCOUNTER — Encounter (HOSPITAL_COMMUNITY): Payer: Self-pay

## 2015-12-08 ENCOUNTER — Other Ambulatory Visit: Payer: Self-pay

## 2015-12-08 DIAGNOSIS — Z01810 Encounter for preprocedural cardiovascular examination: Secondary | ICD-10-CM | POA: Diagnosis not present

## 2015-12-08 DIAGNOSIS — Z01812 Encounter for preprocedural laboratory examination: Secondary | ICD-10-CM | POA: Insufficient documentation

## 2015-12-08 HISTORY — DX: Sleep apnea, unspecified: G47.30

## 2015-12-08 LAB — BASIC METABOLIC PANEL
Anion gap: 5 (ref 5–15)
BUN: 23 mg/dL — AB (ref 6–20)
CO2: 30 mmol/L (ref 22–32)
Calcium: 8.9 mg/dL (ref 8.9–10.3)
Chloride: 103 mmol/L (ref 101–111)
Creatinine, Ser: 1.27 mg/dL — ABNORMAL HIGH (ref 0.61–1.24)
GFR calc Af Amer: >60 mL/min (ref >60)
GFR, EST NON AFRICAN AMERICAN: 54 mL/min — AB (ref >60)
Glucose, Bld: 90 mg/dL (ref 65–99)
Potassium: 4.5 mmol/L (ref 3.5–5.1)
Sodium: 138 mmol/L (ref 135–145)

## 2015-12-08 LAB — CBC WITH DIFFERENTIAL/PLATELET
BASOS PCT: 1 %
Basophils Absolute: 0 10*3/uL (ref 0.0–0.1)
EOS PCT: 5 %
Eosinophils Absolute: 0.2 10*3/uL (ref 0.0–0.7)
HCT: 35.9 % — ABNORMAL LOW (ref 39.0–52.0)
Hemoglobin: 11.9 g/dL — ABNORMAL LOW (ref 13.0–17.0)
Lymphocytes Relative: 40 %
Lymphs Abs: 1.6 10*3/uL (ref 0.7–4.0)
MCH: 31.5 pg (ref 26.0–34.0)
MCHC: 33.1 g/dL (ref 30.0–36.0)
MCV: 95 fL (ref 78.0–100.0)
MONO ABS: 0.5 10*3/uL (ref 0.1–1.0)
MONOS PCT: 13 %
Neutro Abs: 1.7 10*3/uL (ref 1.7–7.7)
Neutrophils Relative %: 41 %
PLATELETS: 256 10*3/uL (ref 150–400)
RBC: 3.78 MIL/uL — ABNORMAL LOW (ref 4.22–5.81)
RDW: 15 % (ref 11.5–15.5)
WBC: 4.1 10*3/uL (ref 4.0–10.5)

## 2015-12-12 ENCOUNTER — Ambulatory Visit (HOSPITAL_COMMUNITY): Payer: Medicare PPO | Admitting: Anesthesiology

## 2015-12-12 ENCOUNTER — Observation Stay (HOSPITAL_COMMUNITY)
Admission: RE | Admit: 2015-12-12 | Discharge: 2015-12-13 | Disposition: A | Discharge status: Home / Self Care | Payer: Medicare PPO | Source: Ambulatory Visit | Attending: Surgery | Admitting: Surgery

## 2015-12-12 ENCOUNTER — Encounter (HOSPITAL_COMMUNITY): Payer: Self-pay | Admitting: *Deleted

## 2015-12-12 ENCOUNTER — Encounter (HOSPITAL_COMMUNITY)
Admission: RE | Disposition: A | Discharge status: Home / Self Care | Payer: Self-pay | Source: Ambulatory Visit | Attending: Surgery

## 2015-12-12 DIAGNOSIS — Z9989 Dependence on other enabling machines and devices: Secondary | ICD-10-CM | POA: Insufficient documentation

## 2015-12-12 DIAGNOSIS — E042 Nontoxic multinodular goiter: Secondary | ICD-10-CM | POA: Diagnosis present

## 2015-12-12 DIAGNOSIS — G8929 Other chronic pain: Secondary | ICD-10-CM | POA: Diagnosis not present

## 2015-12-12 DIAGNOSIS — I10 Essential (primary) hypertension: Secondary | ICD-10-CM | POA: Insufficient documentation

## 2015-12-12 DIAGNOSIS — N401 Enlarged prostate with lower urinary tract symptoms: Secondary | ICD-10-CM | POA: Insufficient documentation

## 2015-12-12 DIAGNOSIS — R350 Frequency of micturition: Secondary | ICD-10-CM | POA: Insufficient documentation

## 2015-12-12 DIAGNOSIS — Z9089 Acquired absence of other organs: Secondary | ICD-10-CM

## 2015-12-12 DIAGNOSIS — G4730 Sleep apnea, unspecified: Secondary | ICD-10-CM | POA: Diagnosis not present

## 2015-12-12 DIAGNOSIS — E890 Postprocedural hypothyroidism: Secondary | ICD-10-CM

## 2015-12-12 DIAGNOSIS — M549 Dorsalgia, unspecified: Secondary | ICD-10-CM | POA: Diagnosis not present

## 2015-12-12 HISTORY — PX: THYROIDECTOMY: SHX17

## 2015-12-12 SURGERY — THYROIDECTOMY
Anesthesia: General

## 2015-12-12 MED ORDER — SUCCINYLCHOLINE CHLORIDE 20 MG/ML IJ SOLN
INTRAMUSCULAR | Status: AC
  Filled 2015-12-12: qty 1

## 2015-12-12 MED ORDER — NEOSTIGMINE METHYLSULFATE 10 MG/10ML IV SOLN
INTRAVENOUS | Status: AC
  Filled 2015-12-12: qty 1

## 2015-12-12 MED ORDER — GLYCOPYRROLATE 0.2 MG/ML IJ SOLN
INTRAMUSCULAR | Status: AC
  Filled 2015-12-12: qty 4

## 2015-12-12 MED ORDER — PROPOFOL 10 MG/ML IV BOLUS
INTRAVENOUS | Status: AC
  Filled 2015-12-12: qty 20

## 2015-12-12 MED ORDER — ACETAMINOPHEN 160 MG/5ML PO SOLN: 650.0000 mg | ORAL | Status: DC | PRN

## 2015-12-12 MED ORDER — CHLORHEXIDINE GLUCONATE CLOTH 2 % EX PADS: 6.0000 | MEDICATED_PAD | Freq: Once | CUTANEOUS | Status: DC

## 2015-12-12 MED ORDER — GLYCOPYRROLATE 0.2 MG/ML IJ SOLN
INTRAMUSCULAR | Status: DC | PRN
  Administered 2015-12-12: .6 mg via INTRAVENOUS
  Administered 2015-12-12: 0.2 mg via INTRAVENOUS

## 2015-12-12 MED ORDER — HYDROMORPHONE HCL 1 MG/ML IJ SOLN
1.0000 mg | INTRAMUSCULAR | Status: DC | PRN
  Administered 2015-12-12 – 2015-12-13 (×3): 1 mg via INTRAVENOUS
  Filled 2015-12-12 (×3): qty 1

## 2015-12-12 MED ORDER — ONDANSETRON HCL 4 MG PO TABS
4.0000 mg | ORAL_TABLET | ORAL | Status: DC | PRN
Start: 2015-12-12 — End: 2015-12-13

## 2015-12-12 MED ORDER — LIDOCAINE HCL 1 % IJ SOLN
INTRAMUSCULAR | Status: DC | PRN
  Administered 2015-12-12: 8 mL via INTRAMUSCULAR

## 2015-12-12 MED ORDER — LACTATED RINGERS IV SOLN
INTRAVENOUS | Status: DC
  Administered 2015-12-12 (×2): via INTRAVENOUS

## 2015-12-12 MED ORDER — NEOSTIGMINE METHYLSULFATE 10 MG/10ML IV SOLN
INTRAVENOUS | Status: DC | PRN
  Administered 2015-12-12: 3.5 mg via INTRAVENOUS

## 2015-12-12 MED ORDER — LEVOTHYROXINE SODIUM 100 MCG PO TABS
100.0000 ug | ORAL_TABLET | Freq: Every day | ORAL | Status: DC
  Administered 2015-12-13: 100 ug via ORAL
  Filled 2015-12-12 (×2): qty 1

## 2015-12-12 MED ORDER — FENTANYL CITRATE (PF) 100 MCG/2ML IJ SOLN
INTRAMUSCULAR | Status: DC | PRN
  Administered 2015-12-12 (×4): 50 ug via INTRAVENOUS
  Administered 2015-12-12: 25 ug via INTRAVENOUS

## 2015-12-12 MED ORDER — ONDANSETRON HCL 4 MG/2ML IJ SOLN: 4.0000 mg | Freq: Once | INTRAMUSCULAR | Status: DC | PRN

## 2015-12-12 MED ORDER — DEXAMETHASONE SODIUM PHOSPHATE 4 MG/ML IJ SOLN
4.0000 mg | Freq: Once | INTRAMUSCULAR | Status: AC
  Administered 2015-12-12: 4 mg via INTRAVENOUS

## 2015-12-12 MED ORDER — ONDANSETRON HCL 4 MG/2ML IJ SOLN
INTRAMUSCULAR | Status: AC
  Filled 2015-12-12: qty 2

## 2015-12-12 MED ORDER — SUCCINYLCHOLINE CHLORIDE 200 MG/10ML IV SOSY
PREFILLED_SYRINGE | INTRAVENOUS | Status: DC | PRN
  Administered 2015-12-12: 120 mg via INTRAVENOUS

## 2015-12-12 MED ORDER — LIDOCAINE HCL (PF) 1 % IJ SOLN
INTRAMUSCULAR | Status: AC
  Filled 2015-12-12: qty 5

## 2015-12-12 MED ORDER — CALCIUM CARBONATE-VITAMIN D 500-200 MG-UNIT PO TABS
2.0000 | ORAL_TABLET | Freq: Two times a day (BID) | ORAL | Status: DC
  Administered 2015-12-13: 2 via ORAL
  Filled 2015-12-12: qty 2

## 2015-12-12 MED ORDER — ONDANSETRON HCL 4 MG/2ML IJ SOLN
4.0000 mg | Freq: Once | INTRAMUSCULAR | Status: AC
  Administered 2015-12-12: 4 mg via INTRAVENOUS

## 2015-12-12 MED ORDER — LIDOCAINE HCL 1 % IJ SOLN
INTRAMUSCULAR | Status: DC | PRN
  Administered 2015-12-12: 40 mg via INTRADERMAL

## 2015-12-12 MED ORDER — MIDAZOLAM HCL 2 MG/2ML IJ SOLN
INTRAMUSCULAR | Status: AC
  Filled 2015-12-12: qty 2

## 2015-12-12 MED ORDER — FENTANYL CITRATE (PF) 250 MCG/5ML IJ SOLN
INTRAMUSCULAR | Status: AC
  Filled 2015-12-12: qty 5

## 2015-12-12 MED ORDER — PROPOFOL 10 MG/ML IV BOLUS
INTRAVENOUS | Status: DC | PRN
  Administered 2015-12-12: 150 mg via INTRAVENOUS

## 2015-12-12 MED ORDER — ROCURONIUM BROMIDE 100 MG/10ML IV SOLN
INTRAVENOUS | Status: DC | PRN
  Administered 2015-12-12: 32 mg via INTRAVENOUS
  Administered 2015-12-12: 20 mg via INTRAVENOUS
  Administered 2015-12-12: 8 mg via INTRAVENOUS
  Administered 2015-12-12: 10 mg via INTRAVENOUS

## 2015-12-12 MED ORDER — ROCURONIUM BROMIDE 50 MG/5ML IV SOLN
INTRAVENOUS | Status: AC
  Filled 2015-12-12: qty 1

## 2015-12-12 MED ORDER — CEFAZOLIN SODIUM-DEXTROSE 2-4 GM/100ML-% IV SOLN
2.0000 g | INTRAVENOUS | Status: AC
  Administered 2015-12-12: 2 g via INTRAVENOUS
  Filled 2015-12-12: qty 100

## 2015-12-12 MED ORDER — MIDAZOLAM HCL 2 MG/2ML IJ SOLN
1.0000 mg | INTRAMUSCULAR | Status: DC | PRN
  Administered 2015-12-12: 2 mg via INTRAVENOUS

## 2015-12-12 MED ORDER — ONDANSETRON HCL 4 MG/2ML IJ SOLN: 4.0000 mg | INTRAMUSCULAR | Status: DC | PRN

## 2015-12-12 MED ORDER — FENTANYL CITRATE (PF) 100 MCG/2ML IJ SOLN: 25.0000 ug | INTRAMUSCULAR | Status: DC | PRN

## 2015-12-12 MED ORDER — LIDOCAINE HCL (PF) 1 % IJ SOLN
INTRAMUSCULAR | Status: AC
  Filled 2015-12-12: qty 30

## 2015-12-12 MED ORDER — SODIUM CHLORIDE 0.9 % IR SOLN
Status: DC | PRN
  Administered 2015-12-12: 500 mL

## 2015-12-12 MED ORDER — BUPIVACAINE HCL (PF) 0.5 % IJ SOLN
INTRAMUSCULAR | Status: AC
  Filled 2015-12-12: qty 30

## 2015-12-12 MED ORDER — ACETAMINOPHEN 650 MG RE SUPP: 650.0000 mg | RECTAL | Status: DC | PRN

## 2015-12-12 MED ORDER — DEXAMETHASONE SODIUM PHOSPHATE 4 MG/ML IJ SOLN
INTRAMUSCULAR | Status: AC
  Filled 2015-12-12: qty 1

## 2015-12-12 SURGICAL SUPPLY — 55 items
APPLIER CLIP 11 MED OPEN (CLIP)
APPLIER CLIP 9.375 SM OPEN (CLIP)
ATTRACTOMAT 16X20 MAGNETIC DRP (DRAPES) ×3 IMPLANT
BAG HAMPER (MISCELLANEOUS) ×3 IMPLANT
BLADE SURG 15 STRL LF DISP TIS (BLADE) ×1 IMPLANT
BLADE SURG 15 STRL SS (BLADE) ×2
CHLORAPREP W/TINT 10.5 ML (MISCELLANEOUS) ×6 IMPLANT
CLEANER TIP ELECTROSURG 2X2 (MISCELLANEOUS) IMPLANT
CLIP APPLIE 11 MED OPEN (CLIP) IMPLANT
CLIP APPLIE 9.375 SM OPEN (CLIP) IMPLANT
CLOTH BEACON ORANGE TIMEOUT ST (SAFETY) ×3 IMPLANT
COVER LIGHT HANDLE STERIS (MISCELLANEOUS) ×6 IMPLANT
DECANTER SPIKE VIAL GLASS SM (MISCELLANEOUS) ×6 IMPLANT
DERMABOND ADVANCED (GAUZE/BANDAGES/DRESSINGS) ×2
DERMABOND ADVANCED .7 DNX12 (GAUZE/BANDAGES/DRESSINGS) ×1 IMPLANT
DRAPE LAPAROTOMY 77X122 PED (DRAPES) ×3 IMPLANT
DRAPE PROXIMA HALF (DRAPES) ×6 IMPLANT
ELECT NEEDLE TIP 2.8 STRL (NEEDLE) ×3 IMPLANT
ELECT REM PT RETURN 9FT ADLT (ELECTROSURGICAL) ×3
ELECTRODE REM PT RTRN 9FT ADLT (ELECTROSURGICAL) ×1 IMPLANT
FORMALIN 10 PREFIL 120ML (MISCELLANEOUS) ×3 IMPLANT
GAUZE SPONGE 4X4 16PLY XRAY LF (GAUZE/BANDAGES/DRESSINGS) ×3 IMPLANT
GLOVE BIOGEL PI IND STRL 7.0 (GLOVE) ×1 IMPLANT
GLOVE BIOGEL PI IND STRL 7.5 (GLOVE) ×2 IMPLANT
GLOVE BIOGEL PI INDICATOR 7.0 (GLOVE) ×2
GLOVE BIOGEL PI INDICATOR 7.5 (GLOVE) ×4
GLOVE ECLIPSE 7.0 STRL STRAW (GLOVE) ×3 IMPLANT
GLOVE SURG SS PI 7.5 STRL IVOR (GLOVE) ×3 IMPLANT
GOWN STRL REUS W/ TWL LRG LVL3 (GOWN DISPOSABLE) ×2 IMPLANT
GOWN STRL REUS W/TWL LRG LVL3 (GOWN DISPOSABLE) ×7 IMPLANT
HEMOSTAT SURGICEL 4X8 (HEMOSTASIS) ×3 IMPLANT
KIT BLADEGUARD II DBL (SET/KITS/TRAYS/PACK) ×3 IMPLANT
KIT ROOM TURNOVER APOR (KITS) ×3 IMPLANT
MANIFOLD NEPTUNE II (INSTRUMENTS) ×3 IMPLANT
MARKER SKIN DUAL TIP RULER LAB (MISCELLANEOUS) ×3 IMPLANT
NEEDLE HYPO 25X1 1.5 SAFETY (NEEDLE) ×3 IMPLANT
NS IRRIG 1000ML POUR BTL (IV SOLUTION) ×3 IMPLANT
PACK BASIC III (CUSTOM PROCEDURE TRAY) ×2
PACK SRG BSC III STRL LF ECLPS (CUSTOM PROCEDURE TRAY) ×1 IMPLANT
PAD ARMBOARD 7.5X6 YLW CONV (MISCELLANEOUS) ×6 IMPLANT
PENCIL HANDSWITCHING (ELECTRODE) ×3 IMPLANT
SET BASIN LINEN APH (SET/KITS/TRAYS/PACK) ×3 IMPLANT
SHEARS HARMONIC 9CM CVD (BLADE) ×3 IMPLANT
SPONGE INTESTINAL PEANUT (DISPOSABLE) ×30 IMPLANT
SUT SILK 2 0 (SUTURE) ×2
SUT SILK 2-0 18XBRD TIE 12 (SUTURE) ×1 IMPLANT
SUT SILK 3 0 (SUTURE) ×2
SUT SILK 3-0 18XBRD TIE 12 (SUTURE) ×1 IMPLANT
SUT VIC AB 3-0 SH 27 (SUTURE) ×2
SUT VIC AB 3-0 SH 27X BRD (SUTURE) ×1 IMPLANT
SUT VIC AB 4-0 PS2 27 (SUTURE) ×6 IMPLANT
SYR CONTROL 10ML LL (SYRINGE) ×3 IMPLANT
TOWEL OR 17X26 4PK STRL BLUE (TOWEL DISPOSABLE) ×3 IMPLANT
YANKAUER SUCT 12FT TUBE ARGYLE (SUCTIONS) ×3 IMPLANT
YANKAUER SUCT BULB TIP 10FT TU (MISCELLANEOUS) ×3 IMPLANT

## 2015-12-12 NOTE — Interval H&P Note (Signed)
History and Physical Interval Note:  12/12/2015 7:22 AM  Scott Underwood  has presented today for surgery, with the diagnosis of multi-nodule goiter  The various methods of treatment have been discussed with the patient and family. After consideration of risks, benefits and other options for treatment, the patient has consented to  Procedure(s): THYROIDECTOMY (N/A) as a surgical intervention .  The patient's history has been reviewed, patient examined, no change in status, stable for surgery.  I have reviewed the patient's chart and labs.  Questions were answered to the patient's satisfaction.     Ancil LinseyJason Evan Breniya Goertzen

## 2015-12-12 NOTE — Anesthesia Preprocedure Evaluation (Signed)
Anesthesia Evaluation  Patient identified by MRN, date of birth, ID band Patient awake    Reviewed: Allergy & Precautions, NPO status , Patient's Chart, lab work & pertinent test results, reviewed documented beta blocker date and time   Airway Mallampati: II  TM Distance: >3 FB Neck ROM: Full    Dental  (+) Teeth Intact, Dental Advisory Given   Pulmonary sleep apnea and Continuous Positive Airway Pressure Ventilation ,    breath sounds clear to auscultation       Cardiovascular hypertension, Pt. on medications and Pt. on home beta blockers  Rhythm:Regular Rate:Normal     Neuro/Psych PSYCHIATRIC DISORDERS Depression    GI/Hepatic   Endo/Other  Hyperthyroidism   Renal/GU      Musculoskeletal   Abdominal   Peds  Hematology   Anesthesia Other Findings   Reproductive/Obstetrics                            Anesthesia Physical Anesthesia Plan  ASA: III  Anesthesia Plan: General   Post-op Pain Management:    Induction: Intravenous  Airway Management Planned: Oral ETT  Additional Equipment:   Intra-op Plan:   Post-operative Plan: Extubation in OR  Informed Consent: I have reviewed the patients History and Physical, chart, labs and discussed the procedure including the risks, benefits and alternatives for the proposed anesthesia with the patient or authorized representative who has indicated his/her understanding and acceptance.     Plan Discussed with:   Anesthesia Plan Comments:         Anesthesia Quick Evaluation

## 2015-12-12 NOTE — Transfer of Care (Signed)
Immediate Anesthesia Transfer of Care Note  Patient: Scott Underwood  Procedure(s) Performed: Procedure(s): THYROIDECTOMY (N/A)  Patient Location: PACU  Anesthesia Type:General  Level of Consciousness: sedated  Airway & Oxygen Therapy: Patient Spontanous Breathing and Patient connected to face mask oxygen  Post-op Assessment: Report given to RN  Post vital signs: Reviewed and stable  Last Vitals:  Vitals:   12/12/15 0715 12/12/15 0730  BP: (!) 149/73 (!) 151/70  Resp: 20 (!) 51  Temp:      Last Pain:  Vitals:   12/12/15 0627  TempSrc: Oral  PainSc: 10-Worst pain ever      Patients Stated Pain Goal: 6 (12/12/15 16100627)  Complications: No apparent anesthesia complications

## 2015-12-12 NOTE — Op Note (Signed)
SURGICAL OPERATIVE REPORT  DATE OF PROCEDURE: 12/12/2015  ATTENDING Surgeon(s): Ancil LinseyJason Evan Davis, MD  ASSISTANT(S): Franky MachoMark Jenkins, MD  ANESTHESIA: General   PRE-OPERATIVE DIAGNOSIS: Growth of Right thyroid lobe to 9.7 cm (from 5.2 cm in 2015) and Left thyroid lobe to 6.5 cm (from 5.4 cm in 2015)  POST-OPERATIVE DIAGNOSIS: Growth of Right thyroid lobe to >10 cm (from 5.2 cm in 2015) and mildly enlarged Left thyroid lobe (6.5 cm likely includes isthmus or part of Right lobe)  PROCEDURE(S):  1.) Total hemi-thyroidectomy  INTRAOPERATIVE FINDINGS: Right thyroid lobe >10 cm and mildly enlarged isthmus and Left thyroid lobe  INTRAVENOUS FLUIDS: 1000 mL crystalloid   ESTIMATED BLOOD LOSS: 100 mL  URINE OUTPUT: No foley  SPECIMENS: Total thyroid gland  IMPLANTS: None  DRAINS: None  COMPLICATIONS: None apparent  CONDITION AT END OF PROCEDURE: Hemodynamically stable and extubated, voice unchanged, breathing unlabored/WNL  DISPOSITION OF PATIENT: PACU  INDICATIONS FOR PROCEDURE:  Patient is a 75 y.o. male who presented forfindings of growth of his Right thyroid lobe to 9.7 cm (from 5.2 cm in 2015) and Left thyroid lobe to 6.5 cm (from 5.4 cm in 2015). Though medical record indicates dysphasia and occasional shortness of breath, patient has denied any difficulty breathing, swallowing, palpitations, or recent weight loss. All risks, benefits, and alternatives to above procedure were discussed with the patient, who elected to proceed, all of patient's questions were answered to his expressed satisfaction, and informed consent was accordingly obtained and documented.  DETAILS OF PROCEDURE: Patient was brought to the operating suite and appropriately identified. General anesthesia was administered along with appropriate pre-operative antibiotics, and endotracheal intubation was performed by anesthetist. In supine position with both arms tucked along his sides and an inflatable pillow placed  between his scapulae, operative site was prepped and draped in the usual sterile fashion, his head was slightly elevated, and following a brief time out, local anesthetic was injected subcutaneously and a 3 - 4 cm transverse neck incision was made in a natural skin crease approximately 2 cm above the sternal notch and 1 - 2 cm below the cricoid cartilage using a #15 blade scalpel. The incision was then extended deep through subcutaneous tissues and platysma using electrocautery, and subplatysmal flaps were raised superiorly extending to the thyroid cartilage and inferiorly extending to the sternal notch. The sternohyoid and sternothyroid strap muscles were divided in the midline and retracted laterally. The Right thyroid lobe was mobilized from its loose areolar attachments using blunt dissection and electrocautery. The thyroid gland was gently retracted medially, and the middle thyroid vein was identified. It was ligated and divided using the harmonic scalpel. Gentle inferomedial traction was then applied to the thyroid, revealing the superior pole vessels, which were ligated and divided using the harmonic scalpel, taking care to avoid injuring the external branch of the superior laryngeal nerve. The thyroid gland was then gently retracted medially, while the strap muscles were retracted laterally. The inferior thyroid artery was then able to be identified, and close careful examination was performed to prevent injury to the recurrent laryngeal nerve and its distal branches with ligation and division of the inferior thyroidal vessels. Dissection of the Right thyroid lobe was made substantially more difficult by its very large size and vascularity.  Albeit much more easily because much smaller, the right superior and inferior parathyroid glands were identified and carefully mobilized off of the thyroid gland with care taken to preserve each's blood supply. The thyroid gland was then dissected medially from  its  attachments to the trachea at the ligament of Berry using electrocautery and extended to the isthmus. Similar was then performed on the left side with the left recurrent laryngeal nerve similarly protected. The left superior and inferior parathyroid glands were also identified and preserved, along with their blood supply, and dissection was extended towards and to include the isthmus, freeing the thyroid gland from the trachea. Specimen was then handed off the field to be sent for pathology analysis.  The wound was then copiously irrigated and hemostasis was confirmed. A single piece of Surgicel was placed along the tracheal groove on each side, and the strap muscles were re-approximated using running/interrupted 3-0 Vicryl suture(s), after which the platysma was re-approximated using buried interrupted 3-0 Vicryl sutures. Additional local anesthetic was injected into platysma and subcutaneously, and skin was re-approximated using a running subcuticular 4-0 Vicryl suture. Skin was then cleaned, dried, and sterile skin glue was applied. Patient was then safely able to be extubated, awakened, confirmed to phonate appropriately, and transferred to PACU for post-operative monitoring and care.  I was present for all aspects of the above procedure, and there were no complications apparent.

## 2015-12-12 NOTE — Progress Notes (Signed)
1205 Patient arrived to room # 333 from PACU S/P total thyroidectomy. Surgical incision clean, dry and intact at this time with no drainage noted. Patient sleepy with oxygen mask in place at this time. LR @ 2675mL/hr via IV running at this time.

## 2015-12-12 NOTE — Anesthesia Procedure Notes (Signed)
Procedure Name: Intubation Date/Time: 12/12/2015 7:47 AM Performed by: Glynn OctaveANIEL, Tyjay Galindo E Pre-anesthesia Checklist: Patient identified, Patient being monitored, Timeout performed, Emergency Drugs available and Suction available Patient Re-evaluated:Patient Re-evaluated prior to inductionOxygen Delivery Method: Circle System Utilized Preoxygenation: Pre-oxygenation with 100% oxygen Intubation Type: IV induction Ventilation: Mask ventilation without difficulty Laryngoscope Size: Mac and 3 Grade View: Grade II Tube type: Oral Tube size: 7.0 mm Number of attempts: 1 Airway Equipment and Method: stylet Placement Confirmation: ETT inserted through vocal cords under direct vision,  positive ETCO2 and breath sounds checked- equal and bilateral Secured at: 21 cm Tube secured with: Tape Dental Injury: Teeth and Oropharynx as per pre-operative assessment  Comments: Trachea deviated to Lt.

## 2015-12-12 NOTE — Anesthesia Postprocedure Evaluation (Signed)
Anesthesia Post Note  Patient: Scott Underwood  Procedure(s) Performed: Procedure(s) (LRB): THYROIDECTOMY (N/A)  Patient location during evaluation: PACU Anesthesia Type: General Level of consciousness: awake, oriented and patient cooperative Pain management: pain level controlled Vital Signs Assessment: post-procedure vital signs reviewed and stable Respiratory status: spontaneous breathing Cardiovascular status: blood pressure returned to baseline Postop Assessment: no signs of nausea or vomiting Anesthetic complications: no    Last Vitals:  Vitals:   12/12/15 1130 12/12/15 1145  BP: (!) 137/57   Pulse: (!) 58 61  Resp: 14 16  Temp:      Last Pain:  Vitals:   12/12/15 0627  TempSrc: Oral  PainSc: 10-Worst pain ever                 Gate[facility] Ambulatory Surgery CenterDANIEL,Chrysa Rampy

## 2015-12-13 DIAGNOSIS — E042 Nontoxic multinodular goiter: Secondary | ICD-10-CM | POA: Diagnosis not present

## 2015-12-13 LAB — COMPREHENSIVE METABOLIC PANEL
ALT: 15 U/L — ABNORMAL LOW (ref 17–63)
ANION GAP: 10 (ref 5–15)
AST: 22 U/L (ref 15–41)
Albumin: 3.8 g/dL (ref 3.5–5.0)
Alkaline Phosphatase: 47 U/L (ref 38–126)
BILIRUBIN TOTAL: 1 mg/dL (ref 0.3–1.2)
BUN: 20 mg/dL (ref 6–20)
CHLORIDE: 103 mmol/L (ref 101–111)
CO2: 28 mmol/L (ref 22–32)
Calcium: 9 mg/dL (ref 8.9–10.3)
Creatinine, Ser: 1.01 mg/dL (ref 0.61–1.24)
Glucose, Bld: 100 mg/dL — ABNORMAL HIGH (ref 65–99)
POTASSIUM: 3.9 mmol/L (ref 3.5–5.1)
Sodium: 141 mmol/L (ref 135–145)
TOTAL PROTEIN: 6.6 g/dL (ref 6.5–8.1)

## 2015-12-13 MED ORDER — LEVOTHYROXINE SODIUM 100 MCG PO TABS: 100.0000 ug | ORAL_TABLET | Freq: Every day | ORAL | 0 refills | Status: DC

## 2015-12-13 MED ORDER — CALCIUM CARBONATE-VITAMIN D 500-200 MG-UNIT PO TABS: 2.0000 | ORAL_TABLET | Freq: Two times a day (BID) | ORAL | 0 refills | Status: AC

## 2015-12-13 NOTE — Progress Notes (Signed)
SURGICAL PROGRESS NOTE   Hospital Day(s): 0.   Post op day(s): 1 Day Post-Op.   Interval History: Patient seen and examined, no acute events or new complaints overnight, tolerating diet. Patient reports mild sore throat, denies voice change, difficulty swallowing, fever/chills, CP, or SOB.  Review of Systems:  Constitutional: denies fever, chills  HEENT: denies cough or congestion  Respiratory: denies any shortness of breath  Cardiovascular: denies chest pain or palpitations  Gastrointestinal: denies N/V, diarrhea or constipation  Musculoskeletal: denies pain, decreased motor or sensation  Neurological: denies HA or vision/hearing changes   Vital signs in last 24 hours: [min-max] current  Temp:  [97.7 F (36.5 C)-98.8 F (37.1 C)] 98.8 F (37.1 C) (08/08 0500) Pulse Rate:  [58-98] 88 (08/08 0500) Resp:  [12-18] 18 (08/07 1705) BP: (129-184)/(53-81) 162/76 (08/08 0500) SpO2:  [95 %-100 %] 98 % (08/08 0500)             Intake/Output this shift:  No intake/output data recorded.   Intake/Output last 2 shifts:  @IOLAST2SHIFTS @   Physical Exam:  Constitutional: alert, cooperative and no distress  HENT: well-approximated transverse neck incision without erythema or drainage and NT, head is normocephalic without obvious abnormality Eyes: PERRL, EOM's grossly intact and symmetric  Neuro: CN II - XII grossly intact and symmetric without deficit  Respiratory: breathing non-labored at rest  Cardiovascular: regular rate and sinus rhythm  Gastrointestinal: soft, non-tender, and non-distended  Musculoskeletal: UE and LE FROM, motor and sensation grossly intact   Labs:  CMP Latest Ref Rng & Units 12/13/2015 12/08/2015  Glucose 65 - 99 mg/dL 725(D100(H) 90  BUN 6 - 20 mg/dL 20 66(Y23(H)  Creatinine 4.030.61 - 1.24 mg/dL 4.741.01 2.59(D1.27(H)  Sodium 638135 - 145 mmol/L 141 138  Potassium 3.5 - 5.1 mmol/L 3.9 4.5  Chloride 101 - 111 mmol/L 103 103  CO2 22 - 32 mmol/L 28 30  Calcium 8.9 - 10.3 mg/dL 9.0 8.9   Total Protein 6.5 - 8.1 g/dL 6.6 -  Total Bilirubin 0.3 - 1.2 mg/dL 1.0 -  Alkaline Phos 38 - 126 U/L 47 -  AST 15 - 41 U/L 22 -  ALT 17 - 63 U/L 15(L) -    Imaging studies: No new pertinent imaging studies    Assessment/Plan:  75 y.o. male doing well 1 Day Post-Op s/p total thyroidectomy for chronic multinodular goiter with recent Right lobe growth to 9.7 cm from 5.2 cm, complicated by pertinent comorbidities including symptomatic BPH s/p TURP on medication, HTN, and chronic back pain.   - pain control prn   - thyroid hormone replacement and PO calcium supplement   - medical management of co-morbidities with home medications   - diet prn, ambulation encouraged  - discharge planning   All of the above findings and recommendations were discussed with the patient, and all of patient's questions were answered to his expressed satisfaction.  -- Scherrie GerlachJason E. Earlene Plateravis, MD, RPVI [place]: [facility] Office: [phone]

## 2015-12-13 NOTE — Discharge Instructions (Signed)
In addition to included general post-operative instructions for Thyroidectomy,  Diet: Resume home heart healthy diet.   Activity: No heavy lifting (children, pets, laundry) or strenuous activity until follow-up, but light activity and walking are encouraged. Do not drive or drink alcohol if taking narcotic pain medications.   Wound care: 2 days after surgery (Wednesday, 8/9), may shower/get incision wet with soapy water and pat dry (do not rub incisions), but no baths or submerging incision underwater until follow-up.   Medications: Resume all home medications AND thyroid replacement medication + Calcium supplement (prescriptions provided). For mild to moderate pain: acetaminophen (Tylenol) or ibuprofen (if no kidney disease). Narcotic pain medications, if prescribed, can be used for severe pain, though may cause nausea, constipation, and drowsiness. Do not combine Tylenol and Percocet within a 6 hour period as Percocet contains Tylenol.  Call office [phone]) at any time if any questions, worsening pain, fevers/chills, bleeding, drainage from incision site, or other concerns.

## 2015-12-13 NOTE — Progress Notes (Signed)
Patient educated regarding care of surgical incision and medication regime.  Patient verbalized understanding. IV removed from right hand, dry dressing applied.  Vital signs stable. Patient left in stable condition.

## 2015-12-14 NOTE — Discharge Summary (Signed)
Physician Discharge Summary  Patient ID: Scott Underwood MRN: [id number] DOB/AGE: [birth year] 75 y.o.  Admit date: 12/12/2015 Discharge date: 12/13/2015  Admission Diagnoses: Multinodular goiter with significant recent growth  Discharge Diagnoses:  Active Problems:   S/P total thyroidectomy   Discharged Condition: good  Hospital Course: Patient presented for elective total thyroidectomy, which was performed and patient tolerated uneventfully. PO thyroid hormone replacement and calcium supplement were started, patient's pain was well-controlled, and discharge planning was initiated. Patient was discharged home.  Consults: None  Significant Diagnostic Studies: labs:  CMP     Component Value Date/Time   NA 141 12/13/2015 0610   K 3.9 12/13/2015 0610   CL 103 12/13/2015 0610   CO2 28 12/13/2015 0610   GLUCOSE 100 (H) 12/13/2015 0610   BUN 20 12/13/2015 0610   CREATININE 1.01 12/13/2015 0610   CALCIUM 9.0 12/13/2015 0610   PROT 6.6 12/13/2015 0610   ALBUMIN 3.8 12/13/2015 0610   AST 22 12/13/2015 0610   ALT 15 (L) 12/13/2015 0610   ALKPHOS 47 12/13/2015 0610   BILITOT 1.0 12/13/2015 0610   GFRNONAA >60 12/13/2015 0610   GFRAA >60 12/13/2015 0610    Treatments: surgery: Total thyroidectomy  Discharge Exam: Blood pressure (!) 130/48, pulse 73, temperature 97.9 F (36.6 C), resp. rate 18, height 5\' 5"  (1.651 m), weight 86.6 kg (191 lb), SpO2 100 %. Neck: no adenopathy, no JVD, supple, symmetrical, trachea midline and 3-4 cm transverse neck incision NT and well-approximated without erythema or drainage  Disposition: 01-Home or Self Care     Medication List    TAKE these medications   amLODipine 5 MG tablet Commonly known as:  NORVASC Take 5 mg by mouth daily.   aspirin 81 MG tablet Take 81 mg by mouth daily.   calcium-vitamin D 500-200 MG-UNIT tablet Commonly known as:  OSCAL WITH D Take 2 tablets by mouth 2 (two) times daily.   cyanocobalamin 1000 MCG/ML  injection Commonly known as:  (VITAMIN B-12) Inject 1 mL into the muscle every 30 (thirty) days.   DRY EYES OP Apply 1 drop to eye daily as needed (dry eyes).   DULoxetine 30 MG capsule Commonly known as:  CYMBALTA Take 30 mg by mouth daily.   finasteride 5 MG tablet Commonly known as:  PROSCAR Take 5 mg by mouth daily.   levothyroxine 100 MCG tablet Commonly known as:  SYNTHROID, LEVOTHROID Take 1 tablet (100 mcg total) by mouth daily before breakfast.   metoprolol tartrate 25 MG tablet Commonly known as:  LOPRESSOR Take 12.5 mg by mouth 2 (two) times daily.   multivitamin tablet Take 1 tablet by mouth daily.   terazosin 10 MG capsule Commonly known as:  HYTRIN Take 10 mg by mouth at bedtime.      Follow-up Information    Ancil LinseyJason Evan Anacristina Steffek, MD Follow up on 12/27/2015.   Specialty:  General Surgery Why:  Appointment scheduled for 2:30pm Contact information: [address] [place] [address] [place] [facility] [id number] [phone]        Marquis LunchGebre Nida, MD. Schedule an appointment as soon as possible for a visit in 1 week(s).   Specialty:  Endocrinology Contact information: [address] [place] [address] [phone]           Signed: Ancil LinseyJason Evan Catalea Labrecque 12/13/2015, 11:15 AM

## 2015-12-15 ENCOUNTER — Encounter (HOSPITAL_COMMUNITY): Payer: Self-pay | Admitting: Surgery

## 2015-12-16 ENCOUNTER — Other Ambulatory Visit: Payer: Self-pay | Admitting: "Endocrinology

## 2015-12-16 ENCOUNTER — Other Ambulatory Visit: Payer: Self-pay

## 2015-12-16 DIAGNOSIS — E890 Postprocedural hypothyroidism: Secondary | ICD-10-CM

## 2015-12-16 MED ORDER — LEVOTHYROXINE SODIUM 125 MCG PO TABS: 125.0000 ug | ORAL_TABLET | Freq: Every day | ORAL | 2 refills | Status: DC

## 2016-01-02 ENCOUNTER — Ambulatory Visit (INDEPENDENT_AMBULATORY_CARE_PROVIDER_SITE_OTHER): Payer: Medicare PPO | Admitting: "Endocrinology

## 2016-01-02 ENCOUNTER — Encounter: Payer: Self-pay | Admitting: "Endocrinology

## 2016-01-02 VITALS — BP 152/64 | HR 66 | Ht 66.0 in | Wt 196.0 lb

## 2016-01-02 DIAGNOSIS — E890 Postprocedural hypothyroidism: Secondary | ICD-10-CM

## 2016-01-02 MED ORDER — LEVOTHYROXINE SODIUM 150 MCG PO TABS: 150.0000 ug | ORAL_TABLET | Freq: Every day | ORAL | 1 refills | Status: DC

## 2016-01-02 NOTE — Progress Notes (Signed)
Subjective:    Patient ID: Scott Underwood, male    DOB: 03-11-1941, PCP Joaquin CourtsFAVERO,JOHN PATRICK, DO   Past Medical History:  Diagnosis Date  . Depression   . Hypertension   . Hyperthyroidism   . Sleep apnea    Past Surgical History:  Procedure Laterality Date  . BACK SURGERY N/A 2007 2016   had back surgery x 2  . CATARACT EXTRACTION, BILATERAL Bilateral   . CHOLECYSTECTOMY    . THYROIDECTOMY N/A 12/12/2015   Procedure: THYROIDECTOMY;  Surgeon: Ancil LinseyJason Evan Davis, MD;  Location: [facility];  Service: General;  Laterality: N/A;   Social History   Social History  . Marital status: Married    Spouse name: N/A  . Number of children: N/A  . Years of education: N/A   Social History Main Topics  . Smoking status: Never Smoker  . Smokeless tobacco: Never Used  . Alcohol use No  . Drug use: No  . Sexual activity: Not Asked   Other Topics Concern  . None   Social History Narrative  . None   Outpatient Encounter Prescriptions as of 01/02/2016  Medication Sig  . amLODipine (NORVASC) 5 MG tablet Take 5 mg by mouth daily.  . Artificial Tear Ointment (DRY EYES OP) Apply 1 drop to eye daily as needed (dry eyes).  Marland Kitchen. aspirin 81 MG tablet Take 81 mg by mouth daily.  . calcium-vitamin D (OSCAL WITH D) 500-200 MG-UNIT tablet Take 2 tablets by mouth 2 (two) times daily.  . cyanocobalamin (,VITAMIN B-12,) 1000 MCG/ML injection Inject 1 mL into the muscle every 30 (thirty) days.  . DULoxetine (CYMBALTA) 30 MG capsule Take 30 mg by mouth daily.  . finasteride (PROSCAR) 5 MG tablet Take 5 mg by mouth daily.  Marland Kitchen. levothyroxine (SYNTHROID, LEVOTHROID) 150 MCG tablet Take 1 tablet (150 mcg total) by mouth daily before breakfast.  . metoprolol tartrate (LOPRESSOR) 25 MG tablet Take 12.5 mg by mouth 2 (two) times daily.   . Multiple Vitamin (MULTIVITAMIN) tablet Take 1 tablet by mouth daily.  Marland Kitchen. terazosin (HYTRIN) 10 MG capsule Take 10 mg by mouth at bedtime.   . [DISCONTINUED] levothyroxine  (SYNTHROID, LEVOTHROID) 125 MCG tablet Take 1 tablet (125 mcg total) by mouth daily before breakfast.   No facility-administered encounter medications on file as of 01/02/2016.    ALLERGIES: Allergies  Allergen Reactions  . Shellfish Allergy Nausea Only   VACCINATION STATUS:  There is no immunization history on file for this patient.  HPI  75 year old gentleman with medical history as above. He is - he is returning after near total thyroidectomy for large multinodular goiter with compressive symptoms. -Surgical something was negative for malignancy. He is on levothyroxine 125 g by mouth every morning. -He denies shortness of breath nor voice change. He denies family history of thyroid cancer. He denies any exposure to neck radiation.  Review of Systems Constitutional: Gained weight,  no fatigue, no subjective hyperthermia/hypothermia Eyes: no blurry vision, no xerophthalmia ENT: no sore throat, + thyroid nodules palpated in throat, + occasional choking/ dysphagia,  no hoarseness Cardiovascular: no CP/SOB/palpitations/leg swelling Respiratory: no cough/SOB Gastrointestinal: no N/V/D/C Musculoskeletal: no muscle/joint aches Skin: no rashes Neurological: no tremors/numbness/tingling/dizziness Psychiatric: no depression/anxiety  Objective:    BP (!) 152/64   Pulse 66   Ht 5\' 6"  (1.676 m)   Wt 196 lb (88.9 kg)   BMI 31.64 kg/m   Wt Readings from Last 3 Encounters:  01/02/16 196 lb (88.9 kg)  12/13/15 191  lb (86.6 kg)  12/08/15 191 lb (86.6 kg)    Physical Exam Constitutional: overweight, in NAD Eyes: PERRLA, EOMI, no exophthalmos ENT: moist mucous membranes, healing post thyroidectomy scar on anterior lower neck , no cervical lymphadenopathy Cardiovascular: RRR, No MRG Respiratory: CTA B Gastrointestinal: abdomen soft, NT, ND, BS+ Musculoskeletal: no deformities, strength intact in all 4 Skin: moist, warm, no rashes Neurological: no tremor with outstretched hands, DTR  normal in all 4   Labs from outside facility on 10/14/2015 showed TSH less than 0.1, free T4 1.01, ft3 0.96 -Thyroid [facility] on 11/01/2015 in [facility]: Right lobe enlarged to 9.7 cm with no discrete nodules , with diffuse microcalcifications, left lobe enlarged to 6.5 cm with 1.5 centimeter predominantly solid nodule .   Assessment & Plan:  1. Postsurgical Hypothyroidism  -He is status post total thyroidectomy for large multinodular goiter.  He surgical sample is negative for malignancy. He is on levothyroxine. I would increase the dose to 150 g by mouth every morning.  - We discussed about correct intake of levothyroxine, at fasting, with water, separated by at least 30 minutes from breakfast, and separated by more than 4 hours from calcium, iron, multivitamins, acid reflux medications (PPIs). -Patient is made aware of the fact that thyroid hormone replacement is needed for life, dose to be adjusted by periodic monitoring of thyroid function tests.  - He will return in 8 weeks with repeat thyroid function tests.  - I advised patient to maintain close follow up with Joaquin Courts, DO for primary care needs. Follow up plan: Return in about 8 weeks (around 02/27/2016) for follow up with pre-visit labs.  Marquis Lunch, MD Phone: [phone]  Fax: [phone]   01/02/2016, 11:46 AM

## 2016-03-01 ENCOUNTER — Encounter: Payer: Self-pay | Admitting: "Endocrinology

## 2016-03-01 ENCOUNTER — Ambulatory Visit (INDEPENDENT_AMBULATORY_CARE_PROVIDER_SITE_OTHER): Payer: Medicare PPO | Admitting: "Endocrinology

## 2016-03-01 VITALS — BP 173/71 | HR 77 | Ht 66.0 in | Wt 200.0 lb

## 2016-03-01 DIAGNOSIS — E890 Postprocedural hypothyroidism: Secondary | ICD-10-CM

## 2016-03-01 MED ORDER — LEVOTHYROXINE SODIUM 150 MCG PO TABS: 150.0000 ug | ORAL_TABLET | Freq: Every day | ORAL | 6 refills | Status: DC

## 2016-03-01 NOTE — Progress Notes (Signed)
Subjective:    Patient ID: Scott Underwood, male    DOB: [birth year], PCP Joaquin Courts, DO   Past Medical History:  Diagnosis Date  . Depression   . Hypertension   . Hyperthyroidism   . Sleep apnea    Past Surgical History:  Procedure Laterality Date  . BACK SURGERY N/A 2007 2016   had back surgery x 2  . CATARACT EXTRACTION, BILATERAL Bilateral   . CHOLECYSTECTOMY    . THYROIDECTOMY N/A 12/12/2015   Procedure: THYROIDECTOMY;  Surgeon: Ancil Linsey, MD;  Location: [facility];  Service: General;  Laterality: N/A;   Social History   Social History  . Marital status: Married    Spouse name: N/A  . Number of children: N/A  . Years of education: N/A   Social History Main Topics  . Smoking status: Never Smoker  . Smokeless tobacco: Never Used  . Alcohol use No  . Drug use: No  . Sexual activity: Not Asked   Other Topics Concern  . None   Social History Narrative  . None   Outpatient Encounter Prescriptions as of 03/01/2016  Medication Sig  . amLODipine (NORVASC) 5 MG tablet Take 5 mg by mouth daily.  . Artificial Tear Ointment (DRY EYES OP) Apply 1 drop to eye daily as needed (dry eyes).  Marland Kitchen aspirin 81 MG tablet Take 81 mg by mouth daily.  . calcium-vitamin D (OSCAL WITH D) 500-200 MG-UNIT tablet Take 2 tablets by mouth 2 (two) times daily.  . cyanocobalamin (,VITAMIN B-12,) 1000 MCG/ML injection Inject 1 mL into the muscle every 30 (thirty) days.  . DULoxetine (CYMBALTA) 30 MG capsule Take 30 mg by mouth daily.  . finasteride (PROSCAR) 5 MG tablet Take 5 mg by mouth daily.  Marland Kitchen levothyroxine (SYNTHROID, LEVOTHROID) 150 MCG tablet Take 1 tablet (150 mcg total) by mouth daily before breakfast.  . metoprolol tartrate (LOPRESSOR) 25 MG tablet Take 12.5 mg by mouth 2 (two) times daily.   . Multiple Vitamin (MULTIVITAMIN) tablet Take 1 tablet by mouth daily.  Marland Kitchen terazosin (HYTRIN) 10 MG capsule Take 10 mg by mouth at bedtime.   . [DISCONTINUED] levothyroxine  (SYNTHROID, LEVOTHROID) 150 MCG tablet Take 1 tablet (150 mcg total) by mouth daily before breakfast.   No facility-administered encounter medications on file as of 03/01/2016.    ALLERGIES: Allergies  Allergen Reactions  . Shellfish Allergy Nausea Only   VACCINATION STATUS:  There is no immunization history on file for this patient.  HPI  75 year old gentleman with medical history as above. He is - he is returning after near total thyroidectomy for large multinodular goiter with compressive symptoms. -Surgical something was negative for malignancy. He was supposed to be on LT4 150 mcg, his meds bag did not include Lt4 , he did not know when he ran out.  -He denies shortness of breath nor voice change. He denies family history of thyroid cancer. He denies any exposure to neck radiation.  Review of Systems Constitutional: Gained weight,  no fatigue, no subjective hyperthermia/hypothermia Eyes: no blurry vision, no xerophthalmia ENT: no sore throat, + thyroid nodules palpated in throat, + occasional choking/ dysphagia,  no hoarseness Cardiovascular: no CP/SOB/palpitations/leg swelling Respiratory: no cough/SOB Gastrointestinal: no N/V/D/C Musculoskeletal: no muscle/joint aches Skin: no rashes Neurological: no tremors/numbness/tingling/dizziness Psychiatric: no depression/anxiety  Objective:    BP (!) 173/71   Pulse 77   Ht 5\' 6"  (1.676 m)   Wt 200 lb (90.7 kg)   BMI 32.28 kg/m  Wt Readings from Last 3 Encounters:  03/01/16 200 lb (90.7 kg)  01/02/16 196 lb (88.9 kg)  12/13/15 191 lb (86.6 kg)    Physical Exam Constitutional: overweight, in NAD Eyes: PERRLA, EOMI, no exophthalmos ENT: moist mucous membranes, healed post thyroidectomy scar on anterior lower neck , no cervical lymphadenopathy Cardiovascular: RRR, No MRG Respiratory: CTA B Gastrointestinal: abdomen soft, NT, ND, BS+ Musculoskeletal: no deformities, strength intact in all 4 Skin: moist, warm, no  rashes Neurological: no tremor with outstretched hands, DTR normal in all 4   Labs from outside facility on 10/14/2015 showed TSH less than 0.1, free T4 1.01, ft3 0.96 -[facility] Right lobe enlarged to 9.7 cm with no discrete nodules , with diffuse microcalcifications, left lobe enlarged to 6.5 cm with 1.5 centimeter predominantly solid nodule .   Assessment & Plan:  1. Postsurgical Hypothyroidism  -He is status post total thyroidectomy for large multinodular goiter.  He surgical sample is negative for malignancy. He  Was supposed to be on levothyroxine 150 g by mouth every morning. However patient's medication back does not include levothyroxine and he does not know when he ran out. I will refill his levothyroxine 150 g by mouth every morning. I urged him the need for him to take his medication every day on a consistent basis.  - We discussed about correct intake of levothyroxine, at fasting, with water, separated by at least 30 minutes from breakfast, and separated by more than 4 hours from calcium, iron, multivitamins, acid reflux medications (PPIs). -Patient is made aware of the fact that thyroid hormone replacement is needed for life, dose to be adjusted by periodic monitoring of thyroid function tests.  - He will return in 6 months with repeat thyroid function tests.   - I advised patient to maintain close follow up with Joaquin CourtsFAVERO,JOHN PATRICK, DO for primary care needs. Follow up plan: Return in about 6 months (around 08/30/2016) for follow up with pre-visit labs.  Marquis LunchGebre Latham Kinzler, MD Phone: [phone]  Fax: [phone]   03/01/2016, 12:06 PM

## 2016-03-14 ENCOUNTER — Other Ambulatory Visit: Payer: Self-pay | Admitting: "Endocrinology

## 2016-03-14 MED ORDER — LEVOTHYROXINE SODIUM 125 MCG PO TABS: 125.0000 ug | ORAL_TABLET | Freq: Every day | ORAL | 2 refills | Status: DC

## 2016-03-27 ENCOUNTER — Other Ambulatory Visit: Payer: Self-pay

## 2016-03-27 MED ORDER — LEVOTHYROXINE SODIUM 150 MCG PO TABS: 150.0000 ug | ORAL_TABLET | Freq: Every day | ORAL | 1 refills | Status: DC

## 2016-04-02 ENCOUNTER — Other Ambulatory Visit: Payer: Self-pay

## 2016-04-02 MED ORDER — LEVOTHYROXINE SODIUM 150 MCG PO TABS: 150.0000 ug | ORAL_TABLET | Freq: Every day | ORAL | 1 refills | Status: AC

## 2016-04-02 MED ORDER — LEVOTHYROXINE SODIUM 150 MCG PO TABS: 150.0000 ug | ORAL_TABLET | Freq: Every day | ORAL | 1 refills | Status: DC

## 2016-08-30 ENCOUNTER — Encounter: Payer: Self-pay | Admitting: "Endocrinology

## 2016-08-30 ENCOUNTER — Ambulatory Visit: Payer: Medicare PPO | Admitting: "Endocrinology

## 2016-10-11 ENCOUNTER — Ambulatory Visit: Payer: Medicare PPO | Admitting: "Endocrinology

## 2020-11-04 DEATH — deceased
